# Patient Record
Sex: Female | Born: 1965 | Race: White | Hispanic: No | Marital: Married | Smoking: Never smoker
Health system: Southern US, Community
[De-identification: ages and names within clinical notes are randomized; demographics above are authoritative.]

## PROBLEM LIST (undated history)

## (undated) DIAGNOSIS — T7840XA Allergy, unspecified, initial encounter: Secondary | ICD-10-CM

## (undated) DIAGNOSIS — K219 Gastro-esophageal reflux disease without esophagitis: Secondary | ICD-10-CM

## (undated) DIAGNOSIS — I10 Essential (primary) hypertension: Secondary | ICD-10-CM

## (undated) DIAGNOSIS — R7303 Prediabetes: Secondary | ICD-10-CM

## (undated) DIAGNOSIS — F419 Anxiety disorder, unspecified: Secondary | ICD-10-CM

## (undated) DIAGNOSIS — Z8719 Personal history of other diseases of the digestive system: Secondary | ICD-10-CM

## (undated) HISTORY — PX: COLONOSCOPY: SHX174

## (undated) HISTORY — DX: Gastro-esophageal reflux disease without esophagitis: K21.9

## (undated) HISTORY — PX: CHOLECYSTECTOMY: SHX55

## (undated) HISTORY — DX: Anxiety disorder, unspecified: F41.9

## (undated) HISTORY — DX: Essential (primary) hypertension: I10

## (undated) HISTORY — PX: APPENDECTOMY: SHX54

## (undated) HISTORY — DX: Allergy, unspecified, initial encounter: T78.40XA

## (undated) HISTORY — DX: Personal history of other diseases of the digestive system: Z87.19

## (undated) HISTORY — DX: Prediabetes: R73.03

---

## 1998-06-19 ENCOUNTER — Other Ambulatory Visit: Admission: RE | Admit: 1998-06-19 | Discharge: 1998-06-19 | Payer: Self-pay | Admitting: Obstetrics and Gynecology

## 1999-06-01 ENCOUNTER — Other Ambulatory Visit: Admission: RE | Admit: 1999-06-01 | Discharge: 1999-06-01 | Payer: Self-pay | Admitting: Obstetrics and Gynecology

## 1999-06-16 ENCOUNTER — Inpatient Hospital Stay (HOSPITAL_COMMUNITY): Admission: EM | Admit: 1999-06-16 | Discharge: 1999-06-17 | Payer: Self-pay | Admitting: Emergency Medicine

## 1999-06-16 ENCOUNTER — Encounter: Payer: Self-pay | Admitting: Family Medicine

## 1999-06-16 ENCOUNTER — Encounter (INDEPENDENT_AMBULATORY_CARE_PROVIDER_SITE_OTHER): Payer: Self-pay | Admitting: Specialist

## 1999-06-16 ENCOUNTER — Encounter: Admission: RE | Admit: 1999-06-16 | Discharge: 1999-06-16 | Payer: Self-pay | Admitting: Family Medicine

## 2003-07-10 ENCOUNTER — Inpatient Hospital Stay (HOSPITAL_COMMUNITY): Admission: AD | Admit: 2003-07-10 | Discharge: 2003-07-10 | Payer: Self-pay | Admitting: Obstetrics and Gynecology

## 2003-08-12 ENCOUNTER — Encounter: Admission: RE | Admit: 2003-08-12 | Discharge: 2003-09-30 | Payer: Self-pay | Admitting: Obstetrics and Gynecology

## 2003-08-24 ENCOUNTER — Inpatient Hospital Stay (HOSPITAL_COMMUNITY): Admission: AD | Admit: 2003-08-24 | Discharge: 2003-08-25 | Payer: Self-pay | Admitting: Obstetrics and Gynecology

## 2003-09-30 ENCOUNTER — Inpatient Hospital Stay (HOSPITAL_COMMUNITY): Admission: AD | Admit: 2003-09-30 | Discharge: 2003-10-02 | Payer: Self-pay | Admitting: Obstetrics and Gynecology

## 2003-11-07 ENCOUNTER — Other Ambulatory Visit: Admission: RE | Admit: 2003-11-07 | Discharge: 2003-11-07 | Payer: Self-pay | Admitting: Obstetrics and Gynecology

## 2004-08-24 ENCOUNTER — Ambulatory Visit (HOSPITAL_COMMUNITY): Admission: EM | Admit: 2004-08-24 | Discharge: 2004-08-24 | Payer: Self-pay | Admitting: Emergency Medicine

## 2004-08-24 ENCOUNTER — Encounter (INDEPENDENT_AMBULATORY_CARE_PROVIDER_SITE_OTHER): Payer: Self-pay | Admitting: Specialist

## 2004-12-20 ENCOUNTER — Other Ambulatory Visit: Admission: RE | Admit: 2004-12-20 | Discharge: 2004-12-20 | Payer: Self-pay | Admitting: Obstetrics and Gynecology

## 2005-07-14 ENCOUNTER — Other Ambulatory Visit: Admission: RE | Admit: 2005-07-14 | Discharge: 2005-07-14 | Payer: Self-pay | Admitting: Obstetrics and Gynecology

## 2005-08-05 ENCOUNTER — Encounter: Admission: RE | Admit: 2005-08-05 | Discharge: 2005-08-05 | Payer: Self-pay | Admitting: Obstetrics and Gynecology

## 2006-08-11 ENCOUNTER — Encounter: Admission: RE | Admit: 2006-08-11 | Discharge: 2006-08-11 | Payer: Self-pay | Admitting: Obstetrics and Gynecology

## 2007-04-18 ENCOUNTER — Ambulatory Visit (HOSPITAL_COMMUNITY): Admission: RE | Admit: 2007-04-18 | Discharge: 2007-04-18 | Payer: Self-pay | Admitting: Obstetrics and Gynecology

## 2007-08-10 ENCOUNTER — Encounter: Admission: RE | Admit: 2007-08-10 | Discharge: 2007-08-10 | Payer: Self-pay | Admitting: Obstetrics and Gynecology

## 2007-10-23 ENCOUNTER — Ambulatory Visit: Payer: Self-pay | Admitting: Gastroenterology

## 2007-10-23 DIAGNOSIS — R197 Diarrhea, unspecified: Secondary | ICD-10-CM

## 2007-12-05 ENCOUNTER — Ambulatory Visit: Payer: Self-pay | Admitting: Gastroenterology

## 2007-12-18 ENCOUNTER — Telehealth: Payer: Self-pay | Admitting: Gastroenterology

## 2007-12-19 ENCOUNTER — Encounter: Payer: Self-pay | Admitting: Gastroenterology

## 2007-12-19 ENCOUNTER — Ambulatory Visit: Payer: Self-pay | Admitting: Gastroenterology

## 2007-12-23 ENCOUNTER — Encounter: Payer: Self-pay | Admitting: Gastroenterology

## 2008-08-22 ENCOUNTER — Encounter: Admission: RE | Admit: 2008-08-22 | Discharge: 2008-08-22 | Payer: Self-pay | Admitting: Obstetrics and Gynecology

## 2009-08-25 ENCOUNTER — Encounter: Admission: RE | Admit: 2009-08-25 | Discharge: 2009-08-25 | Payer: Self-pay | Admitting: Obstetrics and Gynecology

## 2010-04-18 ENCOUNTER — Encounter: Payer: Self-pay | Admitting: Obstetrics and Gynecology

## 2010-04-18 ENCOUNTER — Encounter: Payer: Self-pay | Admitting: Family Medicine

## 2010-06-02 ENCOUNTER — Other Ambulatory Visit: Payer: Self-pay | Admitting: Obstetrics and Gynecology

## 2010-06-02 DIAGNOSIS — Z1231 Encounter for screening mammogram for malignant neoplasm of breast: Secondary | ICD-10-CM

## 2010-08-13 NOTE — H&P (Signed)
Jillian Patton, Jillian Patton            ACCOUNT NO.:  [id number]   MEDICAL RECORD NO.:  [id number]          PATIENT TYPE:  INP   LOCATION:  0101                         FACILITY:  [facility]   PHYSICIAN:  Lorre Munroe., M.D.DATE OF BIRTH:  May 05, 1965   DATE OF ADMISSION:  08/24/2004  DATE OF DISCHARGE:                                HISTORY & PHYSICAL   CHIEF COMPLAINT:  Abdominal pain.   HISTORY OF PRESENT ILLNESS:  The patient is a healthy, 45 year old white  female who is known to have gallstones and who has had upper abdominal pain  with nausea for about 12 hours.  She saw her doctor this morning, and a  gallbladder ultrasound showed findings consistent with acute cholecystitis  with a thickened, edematous gallbladder wall.  The patient is referred to me  for care.  She has not had any dark urine or light stools.  Her total  bilirubin is 1.8, and the other liver tests are normal.  She thinks that  perhaps she had an elevated bilirubin in the past.  She has no history of  any liver disease.  She has not had a fever.  She is brought into the  hospital for a laparoscopic cholecystectomy.   PAST MEDICAL HISTORY:  She has high blood pressure which is well controlled  on labetalol.  She also is taking prenatal vitamins.  She is about 10 months  postpartum and is breastfeeding her baby.  She has had an appendectomy about  5 years ago and did well with no anesthetic or medical complications.  She  does not smoke.  She occasionally drinks alcoholic beverages very  moderately.   FAMILY HISTORY AND CHILDHOOD ILLNESSES:  Unremarkable.   A 15-point review of systems is unremarkable except as above.   PHYSICAL EXAMINATION:  VITAL SIGNS:  Afebrile and with normal vital signs as  recorded by nursing staff.  GENERAL APPEARANCE:  No acute distress.  She appears healthy.  HEENT/NECK:  Head, neck, eyes, ears, nose, mouth and throat are unremarkable  with no enlargement of the thyroid.  No thyroid  mass.  No neck mass.  No  abnormalities of the mucosa of the oral cavity.  No scleral icterus.  CHEST:  Clear to auscultation.  HEART:  Rate and rhythm are normal.  No murmur or gallop noted.  BREASTS:  Symmetric, normal and nontender.  No masses.  ABDOMEN:  Mild right upper quadrant tenderness.  No mass palpable.  The  remainder of the abdomen is unremarkable with good bowel sounds.  No masses,  no hernia, no tenderness.  EXTREMITIES:  Normal.  No edema, good pulses.  SKIN:  No lesions are noted.  LYMPH NODES:  Not enlarged in the groin, axilla or neck.  NEUROLOGICAL:  Grossly normal.   IMPRESSION:  Acute cholecystitis and cholelithiasis.   PLAN:  Immediate laparoscopic cholecystectomy.  She agrees to have this done  and accepts the risks of injuries to the intestines, the liver and the bile  ducts.  She accepts the convalescence of a week to 10 days, which is felt to  be necessary.  WB/MEDQ  D:  08/24/2004  T:  08/24/2004  Job:  [id number]

## 2010-08-13 NOTE — Op Note (Signed)
NAMELUCI, BELLUCCI            ACCOUNT NO.:  [id number]   MEDICAL RECORD NO.:  [id number]          PATIENT TYPE:  INP   LOCATION:  0101                         FACILITY:  [facility]   PHYSICIAN:  Lorre Munroe., M.D.DATE OF BIRTH:  [birth year]   DATE OF PROCEDURE:  08/24/2004  DATE OF DISCHARGE:                                 OPERATIVE REPORT   PREOPERATIVE DIAGNOSIS:  Cholelithiasis and acute cholecystitis.   POSTOPERATIVE DIAGNOSIS:  Cholelithiasis and acute cholecystitis.   OPERATION:  Laparoscopic cholecystectomy.   SURGEON:  Lebron Conners, M.D.   ANESTHESIA:  General.   PROCEDURE:  After the patient was monitored and anesthetized and had routine  preparation and draping of the abdomen, I infiltrated local anesthetic just  below the umbilicus and made a short transverse incision there.  Dissected  down to the fascia and opened it about 2 cm in the midline.  Then bluntly  opened the peritoneum.  I felt no adhesions in that area.  I placed a 0  Vicryl purse-string suture in the fascia and secured a Hasson cannula and  inflated the abdomen with CO2.  I saw no abnormalities except for a  distended, edematous gallbladder with a good bit of adhesion of both a  chronic and acute nature.  After anesthetizing for additional port sites,  including a 10 mm epigastric port and two 5 mm right abdominal ports,  retracted the fundus of the gallbladder towards the right shoulder and took  down the adhesions until I could see the infundibulum of the gallbladder.  The infundibulum was very long and narrow, and I followed it down carefully  and noted the emergence of a cystic duct.  Then I put a clip at the junction  of the cystic duct and infundibulum.  Made a small nick just distal to that  and cannulated the cystic duct with a Mixter cholangiogram catheter.  I then  performed a fluoroscopic cholangiogram, which showed adequate length of the  distal cystic duct, normal-sized biliary  ducts with normal anatomy, absence  of filling defects, and free flow into the duodenum.  I then resumed  laparoscopy and removed the clip and catheter and clipped the cystic duct  distally with three clips and then divided between the two clips closest to  the gallbladder.  I clipped the cystic artery with three clips and cut  between the two closest to the gallbladder.  I then used the cautery with  the hook dissector to separate the gallbladder from the liver.  It was very  edematous and thick-walled, as expected.  I did remove it intact.  There was  a little bit of bleeding from the gallbladder fossa, which I controlled with  cautery and with Surgicel.  I felt hemostasis was good at the end of the  operation.  The cystic duct and cystic artery clips appeared to be secure.  I then irrigated the operative area and removed the irrigant.  I saw no  evidence of continued bleeding or leakage of bile.  I removed the  gallbladder through the umbilical incision and tied the purse-string suture,  then removed  the two  lateral ports under direct view of the laparoscope and then allowed the  carbon dioxide to escape through the epigastric port and removed it as well.  I closed all skin incisions with intracuticular 4-0 Vicryl and Steri-Strips.  The patient was stable throughout the operation.      WB/MEDQ  D:  08/24/2004  T:  08/24/2004  Job:  [id number]   cc:   Ernestina Penna, M.D.  [address] [place]  [place] [address]  Fax: [phone]

## 2010-08-26 ENCOUNTER — Ambulatory Visit
Admission: RE | Admit: 2010-08-26 | Discharge: 2010-08-26 | Disposition: A | Discharge status: Home / Self Care | Payer: BC Managed Care – PPO | Source: Ambulatory Visit | Attending: Obstetrics and Gynecology | Admitting: Obstetrics and Gynecology

## 2010-08-26 DIAGNOSIS — Z1231 Encounter for screening mammogram for malignant neoplasm of breast: Secondary | ICD-10-CM

## 2011-06-07 ENCOUNTER — Other Ambulatory Visit: Payer: Self-pay | Admitting: Obstetrics and Gynecology

## 2011-06-07 DIAGNOSIS — Z803 Family history of malignant neoplasm of breast: Secondary | ICD-10-CM

## 2011-06-07 DIAGNOSIS — Z1231 Encounter for screening mammogram for malignant neoplasm of breast: Secondary | ICD-10-CM

## 2011-08-30 ENCOUNTER — Ambulatory Visit: Payer: BC Managed Care – PPO

## 2011-10-04 ENCOUNTER — Ambulatory Visit: Payer: BC Managed Care – PPO

## 2011-12-05 ENCOUNTER — Ambulatory Visit: Payer: BC Managed Care – PPO

## 2011-12-15 ENCOUNTER — Other Ambulatory Visit: Payer: Self-pay | Admitting: Obstetrics and Gynecology

## 2011-12-19 ENCOUNTER — Ambulatory Visit
Admission: RE | Admit: 2011-12-19 | Discharge: 2011-12-19 | Disposition: A | Discharge status: Home / Self Care | Payer: BC Managed Care – PPO | Source: Ambulatory Visit | Attending: Obstetrics and Gynecology | Admitting: Obstetrics and Gynecology

## 2011-12-19 DIAGNOSIS — Z803 Family history of malignant neoplasm of breast: Secondary | ICD-10-CM

## 2011-12-19 DIAGNOSIS — Z1231 Encounter for screening mammogram for malignant neoplasm of breast: Secondary | ICD-10-CM

## 2012-03-03 ENCOUNTER — Ambulatory Visit: Payer: BC Managed Care – PPO | Admitting: Internal Medicine

## 2012-03-03 VITALS — BP 127/79 | HR 65 | Temp 97.7°F | Resp 16 | Ht 63.5 in | Wt 126.0 lb

## 2012-03-03 DIAGNOSIS — I10 Essential (primary) hypertension: Secondary | ICD-10-CM

## 2012-03-03 DIAGNOSIS — R21 Rash and other nonspecific skin eruption: Secondary | ICD-10-CM

## 2012-03-03 DIAGNOSIS — R002 Palpitations: Secondary | ICD-10-CM | POA: Insufficient documentation

## 2012-03-03 NOTE — Progress Notes (Signed)
  Subjective:    Patient ID: Jillian Patton, female    DOB: 02/27/1966, 46 y.o.   MRN: [id number]  HPI Patient presents with red bumps/rash on chest, back, stomach, and arms x 5 days Patient states it does not itch, burn or hurt Patient is also feeling light headed and dizzy occasionally at work No new medications in the past 2 weeks No cold like symptoms in the past 4 weeks Patient takes Claritin and Zyrtec and labetalol    Review of Systems Labetalol followed by Dr. Tanya Nones for palpitations? SVT   no fever chills or night sweats No GI or GU symptoms  Objective:   Physical Exam Vital signs stable Scattered small maculopapular lesions in a symmetric fashion over torso and upper extremities No vesiculation/no petechia/no hives Face and lower extremities not involved No regional adenopathy      Assessment & Plan:  Problem #1 disseminated rash most consistent with pityriasis rosea Reassured Keep skin moist Reck 3 wks if not well

## 2012-10-29 ENCOUNTER — Other Ambulatory Visit: Payer: Self-pay

## 2012-10-29 DIAGNOSIS — Z1231 Encounter for screening mammogram for malignant neoplasm of breast: Secondary | ICD-10-CM

## 2012-10-31 ENCOUNTER — Encounter: Payer: Self-pay | Admitting: Gastroenterology

## 2012-11-07 ENCOUNTER — Encounter: Payer: Self-pay | Admitting: Gastroenterology

## 2012-12-20 ENCOUNTER — Ambulatory Visit: Payer: BC Managed Care – PPO

## 2013-01-03 ENCOUNTER — Other Ambulatory Visit: Payer: Self-pay | Admitting: Obstetrics and Gynecology

## 2013-01-09 ENCOUNTER — Ambulatory Visit
Admission: RE | Admit: 2013-01-09 | Discharge: 2013-01-09 | Disposition: A | Discharge status: Home / Self Care | Payer: BC Managed Care – PPO | Source: Ambulatory Visit

## 2013-01-09 DIAGNOSIS — Z1231 Encounter for screening mammogram for malignant neoplasm of breast: Secondary | ICD-10-CM

## 2013-02-05 ENCOUNTER — Encounter: Payer: BC Managed Care – PPO | Admitting: Gastroenterology

## 2013-11-22 ENCOUNTER — Other Ambulatory Visit: Payer: Self-pay

## 2013-11-22 DIAGNOSIS — Z1231 Encounter for screening mammogram for malignant neoplasm of breast: Secondary | ICD-10-CM

## 2013-12-07 ENCOUNTER — Encounter: Payer: Self-pay | Admitting: Gastroenterology

## 2014-01-09 ENCOUNTER — Other Ambulatory Visit: Payer: Self-pay | Admitting: Obstetrics and Gynecology

## 2014-01-10 LAB — CYTOLOGY - PAP

## 2014-01-13 ENCOUNTER — Encounter (INDEPENDENT_AMBULATORY_CARE_PROVIDER_SITE_OTHER): Payer: Self-pay

## 2014-01-13 ENCOUNTER — Ambulatory Visit
Admission: RE | Admit: 2014-01-13 | Discharge: 2014-01-13 | Disposition: A | Discharge status: Home / Self Care | Payer: 59 | Source: Ambulatory Visit

## 2014-01-13 DIAGNOSIS — Z1231 Encounter for screening mammogram for malignant neoplasm of breast: Secondary | ICD-10-CM

## 2014-05-20 ENCOUNTER — Encounter: Payer: Self-pay | Admitting: Gastroenterology

## 2014-06-09 ENCOUNTER — Ambulatory Visit: Payer: Self-pay | Admitting: Internal Medicine

## 2014-12-04 ENCOUNTER — Other Ambulatory Visit: Payer: Self-pay

## 2014-12-04 DIAGNOSIS — Z1231 Encounter for screening mammogram for malignant neoplasm of breast: Secondary | ICD-10-CM

## 2015-01-15 ENCOUNTER — Ambulatory Visit
Admission: RE | Admit: 2015-01-15 | Discharge: 2015-01-15 | Disposition: A | Discharge status: Home / Self Care | Payer: Commercial Managed Care - HMO | Source: Ambulatory Visit

## 2015-01-15 DIAGNOSIS — Z1231 Encounter for screening mammogram for malignant neoplasm of breast: Secondary | ICD-10-CM

## 2015-05-11 DIAGNOSIS — G5603 Carpal tunnel syndrome, bilateral upper limbs: Secondary | ICD-10-CM | POA: Insufficient documentation

## 2015-12-15 ENCOUNTER — Encounter: Payer: Self-pay | Admitting: Gastroenterology

## 2015-12-16 ENCOUNTER — Other Ambulatory Visit: Payer: Self-pay | Admitting: Obstetrics and Gynecology

## 2015-12-16 DIAGNOSIS — Z1231 Encounter for screening mammogram for malignant neoplasm of breast: Secondary | ICD-10-CM

## 2016-01-05 ENCOUNTER — Other Ambulatory Visit: Payer: Self-pay | Admitting: Family Medicine

## 2016-01-05 DIAGNOSIS — R1011 Right upper quadrant pain: Secondary | ICD-10-CM

## 2016-01-14 ENCOUNTER — Ambulatory Visit (AMBULATORY_SURGERY_CENTER): Payer: Self-pay | Admitting: *Deleted

## 2016-01-14 DIAGNOSIS — Z8371 Family history of colonic polyps: Secondary | ICD-10-CM

## 2016-01-14 MED ORDER — NA SULFATE-K SULFATE-MG SULF 17.5-3.13-1.6 GM/177ML PO SOLN: 1.0000 | Freq: Once | ORAL | 0 refills | Status: AC

## 2016-01-14 NOTE — Progress Notes (Signed)
No egg or soy allergy. No anesthesia problems.  No home O2.  No diet meds.  

## 2016-01-15 ENCOUNTER — Ambulatory Visit
Admission: RE | Admit: 2016-01-15 | Discharge: 2016-01-15 | Disposition: A | Discharge status: Home / Self Care | Payer: Commercial Managed Care - HMO | Source: Ambulatory Visit | Attending: Family Medicine | Admitting: Family Medicine

## 2016-01-15 DIAGNOSIS — R1011 Right upper quadrant pain: Secondary | ICD-10-CM

## 2016-01-21 ENCOUNTER — Ambulatory Visit
Admission: RE | Admit: 2016-01-21 | Discharge: 2016-01-21 | Disposition: A | Discharge status: Home / Self Care | Payer: Commercial Managed Care - HMO | Source: Ambulatory Visit | Attending: Obstetrics and Gynecology | Admitting: Obstetrics and Gynecology

## 2016-01-21 DIAGNOSIS — Z1231 Encounter for screening mammogram for malignant neoplasm of breast: Secondary | ICD-10-CM

## 2016-01-22 ENCOUNTER — Encounter: Payer: Self-pay | Admitting: Gastroenterology

## 2016-02-04 ENCOUNTER — Ambulatory Visit (AMBULATORY_SURGERY_CENTER): Payer: Commercial Managed Care - HMO | Admitting: Gastroenterology

## 2016-02-04 ENCOUNTER — Encounter: Payer: Self-pay | Admitting: Gastroenterology

## 2016-02-04 VITALS — BP 121/69 | HR 71 | Temp 98.6°F | Resp 17 | Ht 63.5 in | Wt 126.0 lb

## 2016-02-04 DIAGNOSIS — Z1211 Encounter for screening for malignant neoplasm of colon: Secondary | ICD-10-CM | POA: Diagnosis not present

## 2016-02-04 DIAGNOSIS — Z1212 Encounter for screening for malignant neoplasm of rectum: Secondary | ICD-10-CM | POA: Diagnosis not present

## 2016-02-04 DIAGNOSIS — Z8371 Family history of colonic polyps: Secondary | ICD-10-CM | POA: Diagnosis not present

## 2016-02-04 DIAGNOSIS — K635 Polyp of colon: Secondary | ICD-10-CM

## 2016-02-04 DIAGNOSIS — D125 Benign neoplasm of sigmoid colon: Secondary | ICD-10-CM

## 2016-02-04 MED ORDER — SODIUM CHLORIDE 0.9 % IV SOLN: 500.0000 mL | INTRAVENOUS | Status: DC

## 2016-02-04 NOTE — Patient Instructions (Signed)
Impression/Recommendations:  Polyp handout given to patient. Hemorrhoids handout given to patient. Diverticulosis handout given to patient.  Repeat colonoscopy in 5 years for surveillance.  YOU HAD AN ENDOSCOPIC PROCEDURE TODAY AT [facility]:   Refer to the procedure report that was given to you for any specific questions about what was found during the examination.  If the procedure report does not answer your questions, please call your gastroenterologist to clarify.  If you requested that your care partner not be given the details of your procedure findings, then the procedure report has been included in a sealed envelope for you to review at your convenience later.  YOU SHOULD EXPECT: Some feelings of bloating in the abdomen. Passage of more gas than usual.  Walking can help get rid of the air that was put into your GI tract during the procedure and reduce the bloating. If you had a lower endoscopy (such as a colonoscopy or flexible sigmoidoscopy) you may notice spotting of blood in your stool or on the toilet paper. If you underwent a bowel prep for your procedure, you may not have a normal bowel movement for a few days.  Please Note:  You might notice some irritation and congestion in your nose or some drainage.  This is from the oxygen used during your procedure.  There is no need for concern and it should clear up in a day or so.  SYMPTOMS TO REPORT IMMEDIATELY:   Following lower endoscopy (colonoscopy or flexible sigmoidoscopy):  Excessive amounts of blood in the stool  Significant tenderness or worsening of abdominal pains  Swelling of the abdomen that is new, acute  Fever of 100F or higher For urgent or emergent issues, a gastroenterologist can be reached at any hour by calling [phone].   DIET:  We do recommend a small meal at first, but then you may proceed to your regular diet.  Drink plenty of fluids but you should avoid alcoholic beverages for 24  hours.  ACTIVITY:  You should plan to take it easy for the rest of today and you should NOT DRIVE or use heavy machinery until tomorrow (because of the sedation medicines used during the test).    FOLLOW UP: Our staff will call the number listed on your records the next business day following your procedure to check on you and address any questions or concerns that you may have regarding the information given to you following your procedure. If we do not reach you, we will leave a message.  However, if you are feeling well and you are not experiencing any problems, there is no need to return our call.  We will assume that you have returned to your regular daily activities without incident.  If any biopsies were taken you will be contacted by phone or by letter within the next 1-3 weeks.  Please call us at [phone] if you have not heard about the biopsies in 3 weeks.    SIGNATURES/CONFIDENTIALITY: You and/or your care partner have signed paperwork which will be entered into your electronic medical record.  These signatures attest to the fact that that the information above on your After Visit Summary has been reviewed and is understood.  Full responsibility of the confidentiality of this discharge information lies with you and/or your care-partner.

## 2016-02-04 NOTE — Op Note (Signed)
[facility] Patient Name: Jillian Patton Procedure Date: 02/04/2016 7:47 AM MRN: [id number] Endoscopist: Ladene Artist , MD Age: 50 Referring MD:  Date of Birth: 1965-09-20 Gender: Female Account #: [id number] Procedure:                Colonoscopy Indications:              Colon cancer screening in patient at increased                            risk: Family history of 1st-degree relative with                            colon polyps Medicines:                Monitored Anesthesia Care Procedure:                Pre-Anesthesia Assessment:                           - Prior to the procedure, a History and Physical                            was performed, and patient medications and                            allergies were reviewed. The patient's tolerance of                            previous anesthesia was also reviewed. The risks                            and benefits of the procedure and the sedation                            options and risks were discussed with the patient.                            All questions were answered, and informed consent                            was obtained. Prior Anticoagulants: The patient has                            taken no previous anticoagulant or antiplatelet                            agents. ASA Grade Assessment: II - A patient with                            mild systemic disease. After reviewing the risks                            and benefits, the patient was deemed in  satisfactory condition to undergo the procedure.                           After obtaining informed consent, the colonoscope                            was passed under direct vision. Throughout the                            procedure, the patient's blood pressure, pulse, and                            oxygen saturations were monitored continuously. The                            Model PCF-[id number]) scope was  introduced                            through the anus and advanced to the the cecum,                            identified by appendiceal orifice and ileocecal                            valve. The ileocecal valve, appendiceal orifice,                            and rectum were photographed. The quality of the                            bowel preparation was good. The colonoscopy was                            performed without difficulty. The patient tolerated                            the procedure well. Scope In: 8:17:47 AM Scope Out: 8:31:44 AM Scope Withdrawal Time: 0 hours 10 minutes 55 seconds  Total Procedure Duration: 0 hours 13 minutes 57 seconds  Findings:                 The perianal and digital rectal examinations were                            normal.                           A 5 mm polyp was found in the sigmoid colon. The                            polyp was sessile. The polyp was removed with a                            cold snare. Resection and retrieval were complete.  Internal hemorrhoids were found during                            retroflexion. The hemorrhoids were small and Grade                            I (internal hemorrhoids that do not prolapse).                           A few small-mouthed diverticula were found in the                            sigmoid colon.                           The exam was otherwise without abnormality on                            direct and retroflexion views. Complications:            No immediate complications. Estimated blood loss:                            None. Estimated Blood Loss:     Estimated blood loss: none. Impression:               - One 5 mm polyp in the sigmoid colon, removed with                            a cold snare. Resected and retrieved.                           - Internal hemorrhoids.                           - Diverticulosis in the sigmoid colon.                            - The examination was otherwise normal on direct                            and retroflexion views. Recommendation:           - Repeat colonoscopy in 5 years for surveillance.                           - Patient has a contact number available for                            emergencies. The signs and symptoms of potential                            delayed complications were discussed with the                            patient. Return to normal activities tomorrow.  Written discharge instructions were provided to the                            patient.                           - Resume previous diet.                           - Continue present medications.                           - Await pathology results. Ladene Artist, MD 02/04/2016 8:36:47 AM This report has been signed electronically.

## 2016-02-04 NOTE — Progress Notes (Signed)
Called to room to assist during endoscopic procedure.  Patient ID and intended procedure confirmed with present staff. Received instructions for my participation in the procedure from the performing physician.  

## 2016-02-05 ENCOUNTER — Telehealth: Payer: Self-pay | Admitting: *Deleted

## 2016-02-05 NOTE — Telephone Encounter (Signed)
  Follow up Call-  Call back number 02/04/2016  Post procedure Call Back phone  # [phone]  Permission to leave phone message Yes  Some recent data might be hidden     Patient questions:  Do you have a fever, pain , or abdominal swelling? No. Pain Score  0 *  Have you tolerated food without any problems? Yes.    Have you been able to return to your normal activities? Yes.    Do you have any questions about your discharge instructions: Diet   No. Medications  No. Follow up visit  No.  Do you have questions or concerns about your Care? No.  Actions: * If pain score is 4 or above: No action needed, pain <4.

## 2016-02-11 ENCOUNTER — Encounter: Payer: Self-pay | Admitting: Gastroenterology

## 2016-02-16 ENCOUNTER — Telehealth: Payer: Self-pay | Admitting: Gastroenterology

## 2016-02-16 NOTE — Telephone Encounter (Signed)
I reviewed the results with the patient and remailed the pathology letter

## 2016-07-14 DIAGNOSIS — Z79899 Other long term (current) drug therapy: Secondary | ICD-10-CM | POA: Diagnosis not present

## 2016-07-14 DIAGNOSIS — I10 Essential (primary) hypertension: Secondary | ICD-10-CM | POA: Diagnosis not present

## 2016-07-14 DIAGNOSIS — Z23 Encounter for immunization: Secondary | ICD-10-CM | POA: Diagnosis not present

## 2016-07-14 DIAGNOSIS — J309 Allergic rhinitis, unspecified: Secondary | ICD-10-CM | POA: Diagnosis not present

## 2016-07-14 DIAGNOSIS — E559 Vitamin D deficiency, unspecified: Secondary | ICD-10-CM | POA: Diagnosis not present

## 2016-08-19 DIAGNOSIS — L309 Dermatitis, unspecified: Secondary | ICD-10-CM | POA: Diagnosis not present

## 2016-08-19 DIAGNOSIS — L219 Seborrheic dermatitis, unspecified: Secondary | ICD-10-CM | POA: Diagnosis not present

## 2016-11-22 DIAGNOSIS — R1011 Right upper quadrant pain: Secondary | ICD-10-CM | POA: Diagnosis not present

## 2016-12-22 DIAGNOSIS — Z23 Encounter for immunization: Secondary | ICD-10-CM | POA: Diagnosis not present

## 2016-12-29 ENCOUNTER — Other Ambulatory Visit: Payer: Self-pay | Admitting: Obstetrics and Gynecology

## 2016-12-29 DIAGNOSIS — Z1239 Encounter for other screening for malignant neoplasm of breast: Secondary | ICD-10-CM

## 2017-01-13 ENCOUNTER — Other Ambulatory Visit (INDEPENDENT_AMBULATORY_CARE_PROVIDER_SITE_OTHER): Payer: 59

## 2017-01-13 ENCOUNTER — Ambulatory Visit (INDEPENDENT_AMBULATORY_CARE_PROVIDER_SITE_OTHER): Payer: 59 | Admitting: Gastroenterology

## 2017-01-13 ENCOUNTER — Encounter: Payer: Self-pay | Admitting: Gastroenterology

## 2017-01-13 VITALS — BP 128/90 | HR 80 | Ht 63.5 in | Wt 155.0 lb

## 2017-01-13 DIAGNOSIS — R079 Chest pain, unspecified: Secondary | ICD-10-CM

## 2017-01-13 DIAGNOSIS — R1011 Right upper quadrant pain: Secondary | ICD-10-CM | POA: Diagnosis not present

## 2017-01-13 LAB — HEPATIC FUNCTION PANEL
ALBUMIN: 4.8 g/dL (ref 3.5–5.2)
ALK PHOS: 62 U/L (ref 39–117)
ALT: 21 U/L (ref 0–35)
AST: 19 U/L (ref 0–37)
BILIRUBIN TOTAL: 1.1 mg/dL (ref 0.2–1.2)
Bilirubin, Direct: 0.1 mg/dL (ref 0.0–0.3)
Total Protein: 7.2 g/dL (ref 6.0–8.3)

## 2017-01-13 LAB — LIPASE: Lipase: 38 U/L (ref 11.0–59.0)

## 2017-01-13 LAB — CBC WITH DIFFERENTIAL/PLATELET
BASOS PCT: 0.5 % (ref 0.0–3.0)
Basophils Absolute: 0 10*3/uL (ref 0.0–0.1)
EOS PCT: 1.6 % (ref 0.0–5.0)
Eosinophils Absolute: 0.1 10*3/uL (ref 0.0–0.7)
HCT: 41.3 % (ref 36.0–46.0)
HEMOGLOBIN: 14.4 g/dL (ref 12.0–15.0)
Lymphocytes Relative: 33.8 % (ref 12.0–46.0)
Lymphs Abs: 2.3 10*3/uL (ref 0.7–4.0)
MCHC: 34.9 g/dL (ref 30.0–36.0)
MCV: 89.7 fl (ref 78.0–100.0)
MONOS PCT: 6.9 % (ref 3.0–12.0)
Monocytes Absolute: 0.5 10*3/uL (ref 0.1–1.0)
Neutro Abs: 3.9 10*3/uL (ref 1.4–7.7)
Neutrophils Relative %: 57.2 % (ref 43.0–77.0)
Platelets: 241 10*3/uL (ref 150.0–400.0)
RBC: 4.61 Mil/uL (ref 3.87–5.11)
RDW: 12.8 % (ref 11.5–15.5)
WBC: 6.8 10*3/uL (ref 4.0–10.5)

## 2017-01-13 LAB — BASIC METABOLIC PANEL
BUN: 18 mg/dL (ref 6–23)
CALCIUM: 9.9 mg/dL (ref 8.4–10.5)
CO2: 31 mEq/L (ref 19–32)
Chloride: 98 mEq/L (ref 96–112)
Creatinine, Ser: 0.63 mg/dL (ref 0.40–1.20)
GFR: 106.31 mL/min (ref >60.00)
GLUCOSE: 97 mg/dL (ref 70–99)
POTASSIUM: 3.8 meq/L (ref 3.5–5.1)
SODIUM: 139 meq/L (ref 135–145)

## 2017-01-13 LAB — TSH: TSH: 0.6 u[IU]/mL (ref 0.35–4.50)

## 2017-01-13 NOTE — Addendum Note (Signed)
Addended by: Marzella Schlein on: 01/13/2017 11:56 AM   Modules accepted: Orders

## 2017-01-13 NOTE — Progress Notes (Signed)
History of Present Illness: This is a 51 year old female for the evaluation of intermittent lower right chest and RUQ pain. She describes the pain as an ache or soreness lasting last several days at a time. It is not associated with any particular activity and not associated with meals or bowel movements. She relates that blood work performed by her PCP in October 2017 was unremarkable. Abdominal ultrasound below. She has not tried acid reducing medications or over-the-counter analgesics for her symptoms. Denies weight loss, constipation, diarrhea, change in stool caliber, melena, hematochezia, nausea, vomiting, dysphagia, reflux symptoms.  Abd [place] 12/2015 IMPRESSION: Status post cholecystectomy. No other significant abnormality seen in the abdomen.   Allergies  Allergen Reactions  . Sulfamethoxazole-Trimethoprim Other (See Comments)    Patient cant remeber  . Latex Rash   Outpatient Medications Prior to Visit  Medication Sig Dispense Refill  . ALPRAZolam (XANAX) 0.5 MG tablet Take 0.5 mg by mouth at bedtime as needed for anxiety.    . Calcium Citrate (CITRACAL PO) Take 1 tablet by mouth daily.     . cetirizine (ZYRTEC) 10 MG tablet Take 10 mg by mouth daily.    . cholecalciferol (VITAMIN D) 1000 UNITS tablet Take 1,000 Units by mouth daily.    Marland Kitchen losartan-hydrochlorothiazide (HYZAAR) 50-12.5 MG tablet Take 1 tablet by mouth daily.    . montelukast (SINGULAIR) 10 MG tablet Take 10 mg by mouth at bedtime.    . Multiple Vitamin (MULTI-DAY PO) Take by mouth.     Facility-Administered Medications Prior to Visit  Medication Dose Route Frequency Provider Last Rate Last Dose  . 0.9 %  sodium chloride infusion  500 mL Intravenous Continuous Ladene Artist, MD       Past Medical History:  Diagnosis Date  . Allergy   . Anxiety   . Hypertension    Past Surgical History:  Procedure Laterality Date  . APPENDECTOMY    . CHOLECYSTECTOMY    . COLONOSCOPY     Social History   Social  History  . Marital status: Married    Spouse name: N/A  . Number of children: N/A  . Years of education: N/A   Social History Main Topics  . Smoking status: Never Smoker  . Smokeless tobacco: Never Used  . Alcohol use 2.0 oz/week    4 Standard drinks or equivalent per week  . Drug use: No  . Sexual activity: Not Asked   Other Topics Concern  . None   Social History Narrative  . None   Family History  Problem Relation Age of Onset  . Hypertension Mother   . Colon polyps Mother   . Hypertension Father   . Colon polyps Father   . Colon cancer Neg Hx       Review of Systems: Pertinent positive and negative review of systems were noted in the above HPI section. All other review of systems were otherwise negative.    Physical Exam: General: Well developed, well nourished, no acute distress Head: Normocephalic and atraumatic Eyes:  sclerae anicteric, EOMI Ears: Normal auditory acuity Mouth: No deformity or lesions Neck: Supple, no masses or thyromegaly Lungs: Clear throughout to auscultation Chest: She localized the area of pain to her right lower anterior ribs/costal margin, however no tenderness was noted today.  Heart: Regular rate and rhythm; no murmurs, rubs or bruits Abdomen: Soft, non tender and non distended. No masses, hepatosplenomegaly or hernias noted. Normal Bowel sounds Rectal: not done Musculoskeletal: Symmetrical with no  gross deformities  Skin: No lesions on visible extremities Pulses:  Normal pulses noted Extremities: No clubbing, cyanosis, edema or deformities noted Neurological: Alert oriented x 4, grossly nonfocal Cervical Nodes:  No significant cervical adenopathy Inguinal Nodes: No significant inguinal adenopathy Psychological:  Alert and cooperative. Normal mood and affect  Assessment and Recommendations:  1. Right costal margin pain/RUQ pain. Rule out occult gastrointestinal lesions. Suspect that her symptoms are musculoskeletal. Advised her  to try Advil or Tylenol when her symptoms flare. Obtain CMP, CBC, TSH, lipase today. Schedule abdominal CT scan. If symptoms persist without clear etiology determined consider EGD.  2. Personal history of adenomatous colon polyps. Five-year interval surveillance colonoscopy is recommended in November 2022

## 2017-01-13 NOTE — Patient Instructions (Signed)
Your physician has requested that you go to the basement for lab work before leaving today.  You have been scheduled for a CT scan of the abdomen and pelvis at [facility] ([address]---this is in the same building as Press [facility]).   You are scheduled on 01/18/17 at 9:30am. You should arrive 15 minutes prior to your appointment time for registration. Please follow the written instructions below on the day of your exam:  WARNING: IF YOU ARE ALLERGIC TO IODINE/X-RAY DYE, PLEASE NOTIFY RADIOLOGY IMMEDIATELY AT [phone]! YOU WILL BE GIVEN A 13 HOUR PREMEDICATION PREP.  1) Do not eat or drink anything after 5:30am (4 hours prior to your test) 2) You have been given 1 bottles of oral contrast to drink. The solution may taste               better if refrigerated, but do NOT add ice or any other liquid to this solution. Shake             well before drinking.      Drink 1 bottle of contrast @ 8:30am (1 hour prior to your exam)  You may take any medications as prescribed with a small amount of water except for the following: Metformin, Glucophage, Glucovance, Avandamet, Riomet, Fortamet, Actoplus Met, Janumet, Glumetza or Metaglip. The above medications must be held the day of the exam AND 48 hours after the exam.  The purpose of you drinking the oral contrast is to aid in the visualization of your intestinal tract. The contrast solution may cause some diarrhea. Before your exam is started, you will be given a small amount of fluid to drink. Depending on your individual set of symptoms, you may also receive an intravenous injection of x-ray contrast/dye. Plan on being at [facility] for 30 minutes or longer, depending on the type of exam you are having performed.  This test typically takes 30-45 minutes to complete.  If you have any questions regarding your exam or if you need to reschedule, you may call the CT department at [phone] between the hours of 8:00 am and  5:00 pm, Monday-Friday.  ________________________________________________________________________  Thank you for choosing me and [facility].  Pricilla Riffle. Dagoberto Ligas., MD., Marval Regal

## 2017-01-18 ENCOUNTER — Ambulatory Visit (INDEPENDENT_AMBULATORY_CARE_PROVIDER_SITE_OTHER)
Admission: RE | Admit: 2017-01-18 | Discharge: 2017-01-18 | Disposition: A | Discharge status: Home / Self Care | Payer: 59 | Source: Ambulatory Visit | Attending: Gastroenterology | Admitting: Gastroenterology

## 2017-01-18 DIAGNOSIS — R1011 Right upper quadrant pain: Secondary | ICD-10-CM | POA: Diagnosis not present

## 2017-01-18 DIAGNOSIS — K5730 Diverticulosis of large intestine without perforation or abscess without bleeding: Secondary | ICD-10-CM | POA: Diagnosis not present

## 2017-01-18 DIAGNOSIS — R079 Chest pain, unspecified: Secondary | ICD-10-CM

## 2017-01-18 MED ORDER — IOPAMIDOL (ISOVUE-300) INJECTION 61%
100.0000 mL | Freq: Once | INTRAVENOUS | Status: AC | PRN
  Administered 2017-01-18: 100 mL via INTRAVENOUS

## 2017-01-20 ENCOUNTER — Ambulatory Visit (AMBULATORY_SURGERY_CENTER): Payer: Self-pay

## 2017-01-20 VITALS — Ht 63.5 in | Wt 158.6 lb

## 2017-01-20 DIAGNOSIS — R1011 Right upper quadrant pain: Secondary | ICD-10-CM

## 2017-01-20 MED ORDER — NA SULFATE-K SULFATE-MG SULF 17.5-3.13-1.6 GM/177ML PO SOLN: 1.0000 | Freq: Once | ORAL | 0 refills | Status: AC

## 2017-01-20 NOTE — Progress Notes (Signed)
Denies allergies to eggs or soy products. Denies complication of anesthesia or sedation. Denies use of weight loss medication. Denies use of O2.   Emmi instructions declined.  

## 2017-01-23 ENCOUNTER — Telehealth: Payer: Self-pay | Admitting: Gastroenterology

## 2017-01-23 NOTE — Telephone Encounter (Signed)
Returned call to pt .  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  Talked through Miralax prep.  Agreed this should be the best choice for her.  Staff msg sent to MS to be sure he doesn't object. Jayvion Stefanski/PV

## 2017-01-24 NOTE — Telephone Encounter (Signed)
OK 

## 2017-01-31 ENCOUNTER — Ambulatory Visit
Admission: RE | Admit: 2017-01-31 | Discharge: 2017-01-31 | Disposition: A | Discharge status: Home / Self Care | Payer: Commercial Managed Care - HMO | Source: Ambulatory Visit | Attending: Obstetrics and Gynecology | Admitting: Obstetrics and Gynecology

## 2017-01-31 DIAGNOSIS — Z1239 Encounter for other screening for malignant neoplasm of breast: Secondary | ICD-10-CM

## 2017-01-31 DIAGNOSIS — Z1231 Encounter for screening mammogram for malignant neoplasm of breast: Secondary | ICD-10-CM | POA: Diagnosis not present

## 2017-02-01 ENCOUNTER — Encounter: Payer: Self-pay | Admitting: Gastroenterology

## 2017-02-13 ENCOUNTER — Encounter: Payer: Self-pay | Admitting: Gastroenterology

## 2017-02-13 ENCOUNTER — Other Ambulatory Visit: Payer: Self-pay

## 2017-02-13 ENCOUNTER — Ambulatory Visit (AMBULATORY_SURGERY_CENTER): Payer: 59 | Admitting: Gastroenterology

## 2017-02-13 VITALS — BP 124/81 | HR 72 | Temp 98.6°F | Resp 27 | Ht 63.5 in | Wt 155.0 lb

## 2017-02-13 DIAGNOSIS — K635 Polyp of colon: Secondary | ICD-10-CM

## 2017-02-13 DIAGNOSIS — R933 Abnormal findings on diagnostic imaging of other parts of digestive tract: Secondary | ICD-10-CM | POA: Diagnosis present

## 2017-02-13 DIAGNOSIS — D125 Benign neoplasm of sigmoid colon: Secondary | ICD-10-CM | POA: Diagnosis not present

## 2017-02-13 MED ORDER — SODIUM CHLORIDE 0.9 % IV SOLN: 500.0000 mL | INTRAVENOUS | Status: DC

## 2017-02-13 NOTE — Patient Instructions (Signed)
YOU HAD AN ENDOSCOPIC PROCEDURE TODAY AT THE [facility]:   Refer to the procedure report that was given to you for any specific questions about what was found during the examination.  If the procedure report does not answer your questions, please call your gastroenterologist to clarify.  If you requested that your care partner not be given the details of your procedure findings, then the procedure report has been included in a sealed envelope for you to review at your convenience later.  YOU SHOULD EXPECT: Some feelings of bloating in the abdomen. Passage of more gas than usual.  Walking can help get rid of the air that was put into your GI tract during the procedure and reduce the bloating. If you had a lower endoscopy (such as a colonoscopy or flexible sigmoidoscopy) you may notice spotting of blood in your stool or on the toilet paper. If you underwent a bowel prep for your procedure, you may not have a normal bowel movement for a few days.  Please Note:  You might notice some irritation and congestion in your nose or some drainage.  This is from the oxygen used during your procedure.  There is no need for concern and it should clear up in a day or so.  SYMPTOMS TO REPORT IMMEDIATELY:   Following lower endoscopy (colonoscopy or flexible sigmoidoscopy):  Excessive amounts of blood in the stool  Significant tenderness or worsening of abdominal pains  Swelling of the abdomen that is new, acute  Fever of 100F or higher   For urgent or emergent issues, a gastroenterologist can be reached at any hour by calling [phone].   DIET:  We do recommend a small meal at first, but then you may proceed to your regular diet.  Drink plenty of fluids but you should avoid alcoholic beverages for 24 hours.  ACTIVITY:  You should plan to take it easy for the rest of today and you should NOT DRIVE or use heavy machinery until tomorrow (because of the sedation medicines used during the test).     FOLLOW UP: Our staff will call the number listed on your records the next business day following your procedure to check on you and address any questions or concerns that you may have regarding the information given to you following your procedure. If we do not reach you, we will leave a message.  However, if you are feeling well and you are not experiencing any problems, there is no need to return our call.  We will assume that you have returned to your regular daily activities without incident.  If any biopsies were taken you will be contacted by phone or by letter within the next 1-3 weeks.  Please call us at [phone] if you have not heard about the biopsies in 3 weeks.    SIGNATURES/CONFIDENTIALITY: You and/or your care partner have signed paperwork which will be entered into your electronic medical record.  These signatures attest to the fact that that the information above on your After Visit Summary has been reviewed and is understood.  Full responsibility of the confidentiality of this discharge information lies with you and/or your care-partner.  Polyp information given. 

## 2017-02-13 NOTE — Progress Notes (Signed)
Pt. Reports no change in her medical or surgical history since her pre-visit 01/20/2017.

## 2017-02-13 NOTE — Op Note (Signed)
[place] Patient Name: Jillian Patton Procedure Date: 02/13/2017 10:35 AM MRN: [id number] Endoscopist: Ladene Artist , MD Age: 51 Referring MD:  Date of Birth: February 07, 1966 Gender: Female Account #: [id number] Procedure:                Colonoscopy Indications:              Abnormal CT of the GI tract Medicines:                Monitored Anesthesia Care Procedure:                Pre-Anesthesia Assessment:                           - Prior to the procedure, a History and Physical                            was performed, and patient medications and                            allergies were reviewed. The patient's tolerance of                            previous anesthesia was also reviewed. The risks                            and benefits of the procedure and the sedation                            options and risks were discussed with the patient.                            All questions were answered, and informed consent                            was obtained. Prior Anticoagulants: The patient has                            taken no previous anticoagulant or antiplatelet                            agents. ASA Grade Assessment: II - A patient with                            mild systemic disease. After reviewing the risks                            and benefits, the patient was deemed in                            satisfactory condition to undergo the procedure.                           After obtaining informed consent, the colonoscope  was passed under direct vision. Throughout the                            procedure, the patient's blood pressure, pulse, and                            oxygen saturations were monitored continuously. The                            Colonoscope was introduced through the anus and                            advanced to the the cecum, identified by                            appendiceal orifice and ileocecal  valve. The                            ileocecal valve, appendiceal orifice, and rectum                            were photographed. The quality of the bowel                            preparation was excellent. The colonoscopy was                            performed without difficulty. The patient tolerated                            the procedure well. Scope In: 10:49:34 AM Scope Out: 11:03:08 AM Scope Withdrawal Time: 0 hours 11 minutes 35 seconds  Total Procedure Duration: 0 hours 13 minutes 34 seconds  Findings:                 The perianal and digital rectal examinations were                            normal.                           A 6 mm polyp was found in the sigmoid colon. The                            polyp was sessile. The polyp was removed with a                            cold snare. Resection and retrieval were complete.                           A few small-mouthed diverticula were found in the                            sigmoid colon. There was no evidence of  diverticular bleeding.                           Internal hemorrhoids were found during                            retroflexion. The hemorrhoids were small and Grade                            I (internal hemorrhoids that do not prolapse).                           The exam was otherwise without abnormality on                            direct and retroflexion views. Complications:            No immediate complications. Estimated blood loss:                            None. Estimated Blood Loss:     Estimated blood loss: none. Impression:               - One 6 mm polyp in the sigmoid colon, removed with                            a cold snare. Resected and retrieved.                           - Mild diverticulosis in the sigmoid colon. There                            was no evidence of diverticular bleeding.                           - Internal hemorrhoids.                            - The examination was otherwise normal on direct                            and retroflexion views. Recommendation:           - Repeat colonoscopy in 5 years for                            screening/surveillance.                           - Patient has a contact number available for                            emergencies. The signs and symptoms of potential                            delayed complications were discussed with the  patient. Return to normal activities tomorrow.                            Written discharge instructions were provided to the                            patient.                           - Resume previous diet.                           - Continue present medications.                           - Await pathology results. Ladene Artist, MD 02/13/2017 11:07:25 AM This report has been signed electronically.

## 2017-02-13 NOTE — Progress Notes (Signed)
Report given to PACU, vss 

## 2017-02-13 NOTE — Progress Notes (Signed)
Called to room to assist during endoscopic procedure.  Patient ID and intended procedure confirmed with present staff. Received instructions for my participation in the procedure from the performing physician.  

## 2017-02-14 ENCOUNTER — Telehealth: Payer: Self-pay | Admitting: *Deleted

## 2017-02-14 NOTE — Telephone Encounter (Signed)
  Follow up Call-  Call back number 02/13/2017 02/04/2016  Post procedure Call Back phone  # [phone]  Permission to leave phone message Yes Yes  Some recent data might be hidden     Patient questions:  Do you have a fever, pain , or abdominal swelling? No. Pain Score  0 *  Have you tolerated food without any problems? Yes.    Have you been able to return to your normal activities? Yes.    Do you have any questions about your discharge instructions: Diet   No. Medications  No. Follow up visit  No.  Do you have questions or concerns about your Care? No.  Actions: * If pain score is 4 or above: No action needed, pain <4.

## 2017-02-15 ENCOUNTER — Encounter: Payer: 59 | Admitting: Gastroenterology

## 2017-02-28 ENCOUNTER — Encounter: Payer: Self-pay | Admitting: Gastroenterology

## 2017-03-01 ENCOUNTER — Encounter: Payer: 59 | Admitting: Gastroenterology

## 2017-04-12 DIAGNOSIS — Z01419 Encounter for gynecological examination (general) (routine) without abnormal findings: Secondary | ICD-10-CM | POA: Diagnosis not present

## 2017-04-12 DIAGNOSIS — Z6826 Body mass index (BMI) 26.0-26.9, adult: Secondary | ICD-10-CM | POA: Diagnosis not present

## 2017-10-03 ENCOUNTER — Ambulatory Visit: Payer: 59 | Admitting: Gastroenterology

## 2017-10-17 DIAGNOSIS — I10 Essential (primary) hypertension: Secondary | ICD-10-CM | POA: Diagnosis not present

## 2017-10-17 DIAGNOSIS — Z0000 Encounter for general adult medical examination without abnormal findings: Secondary | ICD-10-CM | POA: Diagnosis not present

## 2017-10-20 DIAGNOSIS — M25531 Pain in right wrist: Secondary | ICD-10-CM | POA: Diagnosis not present

## 2017-12-11 DIAGNOSIS — Z23 Encounter for immunization: Secondary | ICD-10-CM | POA: Diagnosis not present

## 2018-01-12 ENCOUNTER — Other Ambulatory Visit: Payer: Self-pay | Admitting: Obstetrics and Gynecology

## 2018-01-12 DIAGNOSIS — Z1231 Encounter for screening mammogram for malignant neoplasm of breast: Secondary | ICD-10-CM

## 2018-01-18 DIAGNOSIS — M722 Plantar fascial fibromatosis: Secondary | ICD-10-CM | POA: Diagnosis not present

## 2018-01-18 DIAGNOSIS — M25572 Pain in left ankle and joints of left foot: Secondary | ICD-10-CM | POA: Diagnosis not present

## 2018-02-19 ENCOUNTER — Ambulatory Visit
Admission: RE | Admit: 2018-02-19 | Discharge: 2018-02-19 | Disposition: A | Discharge status: Home / Self Care | Payer: 59 | Source: Ambulatory Visit | Attending: Obstetrics and Gynecology | Admitting: Obstetrics and Gynecology

## 2018-02-19 DIAGNOSIS — Z1231 Encounter for screening mammogram for malignant neoplasm of breast: Secondary | ICD-10-CM

## 2018-04-19 DIAGNOSIS — Z01419 Encounter for gynecological examination (general) (routine) without abnormal findings: Secondary | ICD-10-CM | POA: Diagnosis not present

## 2018-04-19 DIAGNOSIS — Z6827 Body mass index (BMI) 27.0-27.9, adult: Secondary | ICD-10-CM | POA: Diagnosis not present

## 2018-04-30 ENCOUNTER — Other Ambulatory Visit: Payer: Self-pay | Admitting: Obstetrics and Gynecology

## 2018-04-30 DIAGNOSIS — Z803 Family history of malignant neoplasm of breast: Secondary | ICD-10-CM

## 2018-05-22 ENCOUNTER — Other Ambulatory Visit: Payer: 59

## 2018-07-16 ENCOUNTER — Other Ambulatory Visit: Payer: 59

## 2018-08-06 ENCOUNTER — Other Ambulatory Visit: Payer: 59

## 2018-12-10 ENCOUNTER — Other Ambulatory Visit: Payer: 59

## 2018-12-13 ENCOUNTER — Other Ambulatory Visit: Payer: Self-pay | Admitting: Family Medicine

## 2018-12-13 DIAGNOSIS — Z8271 Family history of polycystic kidney: Secondary | ICD-10-CM

## 2018-12-18 ENCOUNTER — Ambulatory Visit
Admission: RE | Admit: 2018-12-18 | Discharge: 2018-12-18 | Disposition: A | Discharge status: Home / Self Care | Payer: 59 | Source: Ambulatory Visit | Attending: Family Medicine | Admitting: Family Medicine

## 2018-12-18 DIAGNOSIS — Z8271 Family history of polycystic kidney: Secondary | ICD-10-CM

## 2019-01-03 ENCOUNTER — Other Ambulatory Visit: Payer: 59

## 2019-01-24 ENCOUNTER — Other Ambulatory Visit: Payer: Self-pay

## 2019-01-24 ENCOUNTER — Ambulatory Visit
Admission: RE | Admit: 2019-01-24 | Discharge: 2019-01-24 | Disposition: A | Discharge status: Home / Self Care | Payer: 59 | Source: Ambulatory Visit | Attending: Obstetrics and Gynecology | Admitting: Obstetrics and Gynecology

## 2019-01-24 DIAGNOSIS — Z803 Family history of malignant neoplasm of breast: Secondary | ICD-10-CM

## 2019-01-24 MED ORDER — GADOBUTROL 1 MMOL/ML IV SOLN
6.0000 mL | Freq: Once | INTRAVENOUS | Status: AC | PRN
  Administered 2019-01-24: 6 mL via INTRAVENOUS

## 2019-01-25 ENCOUNTER — Other Ambulatory Visit: Payer: Self-pay

## 2019-01-25 DIAGNOSIS — Z20822 Contact with and (suspected) exposure to covid-19: Secondary | ICD-10-CM

## 2019-01-26 LAB — NOVEL CORONAVIRUS, NAA: SARS-CoV-2, NAA: NOT DETECTED

## 2019-03-19 ENCOUNTER — Other Ambulatory Visit: Payer: 59

## 2019-03-20 ENCOUNTER — Other Ambulatory Visit: Payer: 59

## 2019-03-25 ENCOUNTER — Other Ambulatory Visit: Payer: Self-pay | Admitting: Obstetrics and Gynecology

## 2019-03-25 DIAGNOSIS — Z1231 Encounter for screening mammogram for malignant neoplasm of breast: Secondary | ICD-10-CM

## 2019-04-30 ENCOUNTER — Other Ambulatory Visit: Payer: 59

## 2019-04-30 ENCOUNTER — Ambulatory Visit: Payer: 59

## 2019-05-14 ENCOUNTER — Ambulatory Visit: Payer: 59

## 2019-07-11 ENCOUNTER — Ambulatory Visit: Admission: RE | Admit: 2019-07-11 | Payer: 59 | Source: Ambulatory Visit

## 2019-07-11 ENCOUNTER — Ambulatory Visit
Admission: RE | Admit: 2019-07-11 | Discharge: 2019-07-11 | Disposition: A | Discharge status: Home / Self Care | Payer: 59 | Source: Ambulatory Visit | Attending: Obstetrics and Gynecology | Admitting: Obstetrics and Gynecology

## 2019-07-11 ENCOUNTER — Other Ambulatory Visit: Payer: Self-pay

## 2019-07-11 ENCOUNTER — Other Ambulatory Visit: Payer: Self-pay | Admitting: Obstetrics and Gynecology

## 2019-07-11 DIAGNOSIS — N63 Unspecified lump in unspecified breast: Secondary | ICD-10-CM

## 2019-07-11 DIAGNOSIS — Z1231 Encounter for screening mammogram for malignant neoplasm of breast: Secondary | ICD-10-CM

## 2020-04-02 ENCOUNTER — Telehealth: Payer: 59 | Admitting: Orthopedic Surgery

## 2020-04-02 DIAGNOSIS — R21 Rash and other nonspecific skin eruption: Secondary | ICD-10-CM

## 2020-04-02 MED ORDER — DOXYCYCLINE HYCLATE 100 MG PO TABS: 100.0000 mg | ORAL_TABLET | Freq: Two times a day (BID) | ORAL | 0 refills | Status: AC

## 2020-04-02 NOTE — Progress Notes (Signed)
E Visit for Rash  We are sorry that you are not feeling well. Here is how we plan to help!  It looks like this could be perioral dermatitis, though rosacea can appear this way as well. I will prescribe doxycycline 100mg  bid for 7 days since the steroids have not worked. Please stop using the hydrocortisone.   HOME CARE:   Take cool showers and avoid direct sunlight.  Apply cool compress or wet dressings.  Take an antihistamine like Benadryl for widespread rashes that itch.  The adult dose of Benadryl is 25-50 mg by mouth 4 times daily.  Caution:  This type of medication may cause sleepiness.  Do not drink alcohol, drive, or operate dangerous machinery while taking antihistamines.  Do not take these medications if you have prostate enlargement.  Read package instructions thoroughly on all medications that you take.  GET HELP RIGHT AWAY IF:   Symptoms don't go away after treatment.  Severe itching that persists.  If you rash spreads or swells.  If you rash begins to smell.  If it blisters and opens or develops a yellow-brown crust.  You develop a fever.  You have a sore throat.  You become short of breath.  MAKE SURE YOU:  Understand these instructions. Will watch your condition. Will get help right away if you are not doing well or get worse.  Thank you for choosing an e-visit. Your e-visit answers were reviewed by a board certified advanced clinical practitioner to complete your personal care plan. Depending upon the condition, your plan could have included both over the counter or prescription medications. Please review your pharmacy choice. Be sure that the pharmacy you have chosen is open so that you can pick up your prescription now.  If there is a problem you may message your provider in MyChart to have the prescription routed to another pharmacy. Your safety is important to . If you have drug allergies check your prescription carefully.  For the next 24 hours,  you can use MyChart to ask questions about today's visit, request a non-urgent call back, or ask for a work or school excuse from your e-visit provider. You will get an email in the next two days asking about your experience. I hope that your e-visit has been valuable and will speed your recovery.   Greater than 5 minutes, yet less than 10 minutes of time have been spent researching, coordinating and implementing care for this patient today.

## 2020-04-03 MED ORDER — METRONIDAZOLE 0.75 % EX LOTN: TOPICAL_LOTION | CUTANEOUS | 0 refills | Status: DC

## 2020-04-03 NOTE — Addendum Note (Signed)
Addended by: Mercer Pod on: 04/03/2020 08:22 AM   Modules accepted: Orders

## 2020-05-26 ENCOUNTER — Other Ambulatory Visit: Payer: Self-pay | Admitting: Obstetrics and Gynecology

## 2020-05-26 DIAGNOSIS — Z1231 Encounter for screening mammogram for malignant neoplasm of breast: Secondary | ICD-10-CM

## 2020-06-16 DIAGNOSIS — J302 Other seasonal allergic rhinitis: Secondary | ICD-10-CM | POA: Insufficient documentation

## 2020-07-20 ENCOUNTER — Other Ambulatory Visit: Payer: Self-pay

## 2020-07-20 ENCOUNTER — Ambulatory Visit
Admission: RE | Admit: 2020-07-20 | Discharge: 2020-07-20 | Disposition: A | Discharge status: Home / Self Care | Payer: 59 | Source: Ambulatory Visit | Attending: Obstetrics and Gynecology | Admitting: Obstetrics and Gynecology

## 2020-07-20 DIAGNOSIS — Z1231 Encounter for screening mammogram for malignant neoplasm of breast: Secondary | ICD-10-CM

## 2021-01-16 ENCOUNTER — Emergency Department (HOSPITAL_BASED_OUTPATIENT_CLINIC_OR_DEPARTMENT_OTHER): Payer: 59

## 2021-01-16 ENCOUNTER — Other Ambulatory Visit: Payer: Self-pay

## 2021-01-16 ENCOUNTER — Emergency Department (HOSPITAL_BASED_OUTPATIENT_CLINIC_OR_DEPARTMENT_OTHER)
Admission: EM | Admit: 2021-01-16 | Discharge: 2021-01-16 | Disposition: A | Discharge status: Home / Self Care | Payer: 59 | Attending: Emergency Medicine | Admitting: Emergency Medicine

## 2021-01-16 ENCOUNTER — Encounter (HOSPITAL_BASED_OUTPATIENT_CLINIC_OR_DEPARTMENT_OTHER): Payer: Self-pay

## 2021-01-16 DIAGNOSIS — Z91040 Latex allergy status: Secondary | ICD-10-CM | POA: Insufficient documentation

## 2021-01-16 DIAGNOSIS — I10 Essential (primary) hypertension: Secondary | ICD-10-CM | POA: Diagnosis not present

## 2021-01-16 DIAGNOSIS — K5792 Diverticulitis of intestine, part unspecified, without perforation or abscess without bleeding: Secondary | ICD-10-CM | POA: Insufficient documentation

## 2021-01-16 DIAGNOSIS — R1032 Left lower quadrant pain: Secondary | ICD-10-CM | POA: Diagnosis present

## 2021-01-16 LAB — COMPREHENSIVE METABOLIC PANEL
ALT: 57 U/L — ABNORMAL HIGH (ref 0–44)
AST: 24 U/L (ref 15–41)
Albumin: 4.5 g/dL (ref 3.5–5.0)
Alkaline Phosphatase: 59 U/L (ref 38–126)
Anion gap: 9 (ref 5–15)
BUN: 12 mg/dL (ref 6–20)
CO2: 30 mmol/L (ref 22–32)
Calcium: 9.6 mg/dL (ref 8.9–10.3)
Chloride: 99 mmol/L (ref 98–111)
Creatinine, Ser: 0.72 mg/dL (ref 0.44–1.00)
GFR, Estimated: >60 mL/min (ref >60)
Glucose, Bld: 114 mg/dL — ABNORMAL HIGH (ref 70–99)
Potassium: 3.4 mmol/L — ABNORMAL LOW (ref 3.5–5.1)
Sodium: 138 mmol/L (ref 135–145)
Total Bilirubin: 1 mg/dL (ref 0.3–1.2)
Total Protein: 7 g/dL (ref 6.5–8.1)

## 2021-01-16 LAB — URINALYSIS, ROUTINE W REFLEX MICROSCOPIC
Bilirubin Urine: NEGATIVE
Glucose, UA: NEGATIVE mg/dL
Hgb urine dipstick: NEGATIVE
Ketones, ur: NEGATIVE mg/dL
Leukocytes,Ua: NEGATIVE
Nitrite: NEGATIVE
Specific Gravity, Urine: >1.046 — ABNORMAL HIGH (ref 1.005–1.030)
pH: 6.5 (ref 5.0–8.0)

## 2021-01-16 LAB — CBC
HCT: 37.3 % (ref 36.0–46.0)
Hemoglobin: 13.1 g/dL (ref 12.0–15.0)
MCH: 31.1 pg (ref 26.0–34.0)
MCHC: 35.1 g/dL (ref 30.0–36.0)
MCV: 88.6 fL (ref 80.0–100.0)
Platelets: 242 10*3/uL (ref 150–400)
RBC: 4.21 MIL/uL (ref 3.87–5.11)
RDW: 12 % (ref 11.5–15.5)
WBC: 9.5 10*3/uL (ref 4.0–10.5)
nRBC: 0 % (ref 0.0–0.2)

## 2021-01-16 MED ORDER — IOHEXOL 300 MG/ML  SOLN
100.0000 mL | Freq: Once | INTRAMUSCULAR | Status: AC | PRN
  Administered 2021-01-16: 100 mL via INTRAVENOUS

## 2021-01-16 MED ORDER — AMOXICILLIN-POT CLAVULANATE 875-125 MG PO TABS: 1.0000 | ORAL_TABLET | Freq: Two times a day (BID) | ORAL | 0 refills | Status: DC

## 2021-01-16 NOTE — ED Provider Notes (Signed)
[facility] Provider Note   CSN: [id number] Arrival date & time: 01/16/21  7616     History Chief Complaint  Patient presents with   Abdominal Pain    Jillian Patton is a 55 y.o. female.  Patient presents with left-sided abdominal pain ongoing for the past 4 days.  Describes an ache and persistent pain in the left lower quadrant otherwise nonradiating.  She had chills 2 days ago but no recorded fevers.  Denies any vomiting or cough.  Denies diarrhea.  She states at rest the pain is not as severe but when she pushes on her left lower quadrant and becomes more intense.      Past Medical History:  Diagnosis Date   Allergy    Anxiety    GERD (gastroesophageal reflux disease)    Hypertension     Patient Active Problem List   Diagnosis Date Noted   DIARRHEA 10/23/2007    Past Surgical History:  Procedure Laterality Date   APPENDECTOMY     CHOLECYSTECTOMY     COLONOSCOPY       OB History   No obstetric history on file.     Family History  Problem Relation Age of Onset   Hypertension Mother    Colon polyps Mother    Breast cancer Mother        32's   Hypertension Father    Colon polyps Father    Colon cancer Neg Hx    Esophageal cancer Neg Hx    Pancreatic cancer Neg Hx    Rectal cancer Neg Hx    Stomach cancer Neg Hx     Social History   Tobacco Use   Smoking status: Never   Smokeless tobacco: Never  Substance Use Topics   Alcohol use: Yes    Alcohol/week: 4.0 standard drinks    Types: 4 Standard drinks or equivalent per week   Drug use: No    Home Medications Prior to Admission medications   Medication Sig Start Date End Date Taking? Authorizing Provider  amoxicillin-clavulanate (AUGMENTIN) 875-125 MG tablet Take 1 tablet by mouth every 12 (twelve) hours. 01/16/21  Yes Elzada Pytel, Greggory Brandy, MD  ALPRAZolam Duanne Moron) 0.5 MG tablet Take 0.5 mg by mouth at bedtime as needed for anxiety.    [provider]  Calcium  Citrate (CITRACAL PO) Take 1 tablet by mouth daily.     [provider]  cetirizine (ZYRTEC) 10 MG tablet Take 10 mg by mouth daily.    [provider]  cholecalciferol (VITAMIN D) 1000 UNITS tablet Take 1,000 Units by mouth daily.    [provider]  losartan-hydrochlorothiazide (HYZAAR) 50-12.5 MG tablet Take 1 tablet by mouth daily.    [provider]  METRONIDAZOLE, TOPICAL, 0.75 % LOTN Apply and rub a thin film twice daily to affected areas after washing 04/03/20   McVey, Gelene Mink, PA-C  montelukast (SINGULAIR) 10 MG tablet Take 10 mg by mouth at bedtime.    [provider]  Multiple Vitamin (MULTI-DAY PO) Take by mouth.    [provider]    Allergies    Sulfamethoxazole-trimethoprim and Latex  Review of Systems   Review of Systems  Constitutional:  Negative for fever.  HENT:  Negative for ear pain.   Eyes:  Negative for pain.  Respiratory:  Negative for cough.   Cardiovascular:  Negative for chest pain.  Gastrointestinal:  Positive for abdominal pain.  Genitourinary:  Negative for flank pain.  Musculoskeletal:  Negative  for back pain.  Skin:  Negative for rash.  Neurological:  Negative for headaches.   Physical Exam Updated Vital Signs BP 125/79   Pulse 67   Temp 98 F (36.7 C) (Oral)   Resp 18   LMP 02/18/2012   SpO2 98%   Physical Exam Constitutional:      General: She is not in acute distress.    Appearance: Normal appearance.  HENT:     Head: Normocephalic.     Nose: Nose normal.  Eyes:     Extraocular Movements: Extraocular movements intact.  Cardiovascular:     Rate and Rhythm: Normal rate.  Pulmonary:     Effort: Pulmonary effort is normal.  Abdominal:     Comments: Mild tenderness in the left lower quadrant/left mid quadrant region.  No guarding or rebound noted.  Musculoskeletal:        General: Normal range of motion.     Cervical back: Normal range of motion.  Neurological:      General: No focal deficit present.     Mental Status: She is alert. Mental status is at baseline.    ED Results / Procedures / Treatments   Labs (all labs ordered are listed, but only abnormal results are displayed) Labs Reviewed  COMPREHENSIVE METABOLIC PANEL - Abnormal; Notable for the following components:      Result Value   Potassium 3.4 (*)    Glucose, Bld 114 (*)    ALT 57 (*)    All other components within normal limits  URINALYSIS, ROUTINE W REFLEX MICROSCOPIC - Abnormal; Notable for the following components:   Specific Gravity, Urine >1.046 (*)    Protein, ur TRACE (*)    All other components within normal limits  CBC    EKG None  Radiology CT Abdomen Pelvis W Contrast  Result Date: 01/16/2021 CLINICAL DATA:  55 year old female left-sided abdominal pain. EXAM: CT ABDOMEN AND PELVIS WITHOUT CONTRAST TECHNIQUE: Multidetector CT imaging of the abdomen and pelvis was performed following the standard protocol without IV contrast. COMPARISON:  CT abdomen from 01/18/2017, CT abdomen pelvis from 04/18/2007 FINDINGS: Lower chest: Bibasilar subsegmental atelectasis. The heart is normal size. No pericardial effusion. Hepatobiliary: No focal liver abnormality is seen. Status post cholecystectomy. No biliary dilatation. Pancreas: Unremarkable. No pancreatic ductal dilatation or surrounding inflammatory changes. Spleen: Normal in size without focal abnormality. Adrenals/Urinary Tract: Adrenal glands are unremarkable. Similar appearing partially exophytic right superior pole simple renal cyst measuring up to 2.7 cm in greatest craniocaudal dimension. Kidneys are normal, without renal calculi, focal lesion, or hydronephrosis. Bladder is unremarkable. Stomach/Bowel: Stomach is within normal limits. The appendix is surgically absent. There are few scattered sigmoid diverticula with associated bowel wall thickening and surrounding mesenteric fat stranding about the proximal sigmoid colon. No  evidence of surrounding fluid collection or evidence of bowel perforation. Vascular/Lymphatic: No significant vascular findings are present. No enlarged abdominal or pelvic lymph nodes. Reproductive: Left uterine fundal mass measuring up to approximately 3.9 x 3.6 x 3.7 cm, mildly hypoenhancing compared to surrounding myometrium. No adnexal masses. Other: No abdominal wall hernia or abnormality. No abdominopelvic ascites. Musculoskeletal: No acute or significant osseous findings. IMPRESSION: 1. Acute uncomplicated sigmoid diverticulitis. 2. Bibasilar subsegmental atelectasis. 3. 3.9 cm mildly hypoenhancing mass in the left uterine fundus, favored represent uterine leiomyoma. Nonemergent pelvic ultrasound could be considered for further evaluation Ruthann Cancer, MD [facility] Electronically Signed   By: Ruthann Cancer M.D.   On: 01/16/2021 11:42  Procedures Procedures   Medications Ordered in ED Medications  iohexol (OMNIPAQUE) 300 MG/ML solution 100 mL (100 mLs Intravenous Contrast Given 01/16/21 1109)    ED Course  I have reviewed the triage vital signs and the nursing notes.  Pertinent labs & imaging results that were available during my care of the patient were reviewed by me and considered in my medical decision making (see chart for details).    MDM Rules/Calculators/A&P                           Labs unremarkable chemistry normal.  Urinalysis shows no evidence of infection.  CT abdomen pelvis pursued.  Consistent with uncomplicated diverticulitis.  Patient given prescription of antibiotic medications and discharged home to follow-up with her doctor within the week.  Advised return for fevers worsening pain vomiting or any additional concerns.  Final Clinical Impression(s) / ED Diagnoses Final diagnoses:  Diverticulitis    Rx / DC Orders ED Discharge Orders          Ordered    amoxicillin-clavulanate (AUGMENTIN)  875-125 MG tablet  Every 12 hours        01/16/21 1148             Luna Fuse, MD 01/16/21 1148

## 2021-01-16 NOTE — ED Triage Notes (Signed)
She c/o left-sided abd. Discomfort since this Tues. She tells [place] she underwnet colonoscopy by Dr. Fuller Plan ~ 2 years ago, which showed "diverticulosis". She is ambulatory and in no distress.

## 2021-01-16 NOTE — ED Notes (Signed)
Pt is aware we need a urine specimen. Call bell within reach.

## 2021-01-16 NOTE — Discharge Instructions (Signed)
Call your primary care doctor or specialist as discussed in the next 2-3 days.   Return immediately back to the ER if:  Your symptoms worsen within the next 12-24 hours. You develop new symptoms such as new fevers, persistent vomiting, new pain, shortness of breath, or new weakness or numbness, or if you have any other concerns.  

## 2021-01-26 DIAGNOSIS — R7303 Prediabetes: Secondary | ICD-10-CM | POA: Insufficient documentation

## 2021-02-09 ENCOUNTER — Encounter: Payer: Self-pay | Admitting: Nurse Practitioner

## 2021-02-09 ENCOUNTER — Ambulatory Visit (INDEPENDENT_AMBULATORY_CARE_PROVIDER_SITE_OTHER): Payer: 59 | Admitting: Nurse Practitioner

## 2021-02-09 VITALS — BP 116/70 | HR 83 | Ht 63.0 in | Wt 171.6 lb

## 2021-02-09 DIAGNOSIS — K5792 Diverticulitis of intestine, part unspecified, without perforation or abscess without bleeding: Secondary | ICD-10-CM

## 2021-02-09 NOTE — Progress Notes (Signed)
ASSESSMENT AND PLAN    #55 year old female with recent episode of uncomplicated sigmoid diverticulitis found on CTAP in ED. total resolution of LLQ pain with course of Augmentin --Recommended high-fiber diet --In the event of future episodes of LLQ pain advised patient that she should start herself on clear liquid diet and contact the office -- Her last colonoscopy was in 2018 and she is due for another November of next year  # [facility] of precancerous polyps in both parents in their early 31's. Patient has no personal history of adenomatous colon polyps. She is due for a 5 year colonoscopy in November 2023.   # Uterine leiomyoma on CTAP. We briefly discussed and she will talk with Dr. Helane Rima,  her GYN , to make sure no further work-up is warranted  HISTORY OF PRESENT ILLNESS     Chief Complaint : Recent diverticulitis  Jillian Patton is a 55 y.o. female with a past medical history significant for diverticulosis, chronic diarrhea, GERD, appendectomy, cholecystectomy. See PMH below for any additional history.   Patient is known to Dr. Fuller Plan, last seen 2018 at time of colonoscopy.    Jillian Patton was in the ED October 22 for evaluation of left lower quadrant pain and chills.  WBC was normal at 9.5, hemoglobin normal at 13.1 . CT scan showed uncomplicated diverticulitis   CTAP w/ contrast 1. Acute uncomplicated sigmoid diverticulitis. 2. Bibasilar subsegmental atelectasis. 3. 3.9 cm mildly hypoenhancing mass in the left uterine fundus, favored represent uterine leiomyoma. Nonemergent pelvic ultrasound could be considered for further evaluation  She is due for a 5 year colonoscopy in November 2023  PREVIOUS GI EVALUATIONS:   Last colonoscopy  Colonoscopy 2018 for evaluation of abnormal CT scan -- 6 mm sessile polyp removed from the sigmoid colon.  A few sigmoid diverticula, internal hemorrhoids.  Polyp was not adenomatous but rather benign mucosa.  Repeat colonoscopy recommended in 5  years    Past Medical History:  Diagnosis Date   Allergy    Anxiety    GERD (gastroesophageal reflux disease)    History of diverticulitis    Hypertension      Past Surgical History:  Procedure Laterality Date   APPENDECTOMY     CHOLECYSTECTOMY     COLONOSCOPY     Family History  Problem Relation Age of Onset   Hypertension Mother    Colon polyps Mother    Breast cancer Mother        69's   Hypertension Father    Colon polyps Father    Colon cancer Neg Hx    Esophageal cancer Neg Hx    Pancreatic cancer Neg Hx    Rectal cancer Neg Hx    Stomach cancer Neg Hx    Social History   Tobacco Use   Smoking status: Never   Smokeless tobacco: Never  Substance Use Topics   Alcohol use: Yes    Alcohol/week: 4.0 standard drinks    Types: 4 Standard drinks or equivalent per week   Drug use: No   Current Outpatient Medications  Medication Sig Dispense Refill   ALPRAZolam (XANAX) 0.5 MG tablet Take 0.5 mg by mouth at bedtime as needed for anxiety.     Calcium Citrate (CITRACAL PO) Take 1 tablet by mouth daily.      cetirizine (ZYRTEC) 10 MG tablet Take 10 mg by mouth daily.     cholecalciferol (VITAMIN D) 1000 UNITS tablet Take 1,000 Units by mouth daily.  escitalopram (LEXAPRO) 20 MG tablet Take 1 tablet by mouth daily.     METRONIDAZOLE, TOPICAL, 0.75 % LOTN Apply and rub a thin film twice daily to affected areas after washing 59 mL 0   montelukast (SINGULAIR) 10 MG tablet Take 10 mg by mouth at bedtime.     MOUNJARO 2.5 MG/0.5ML Pen Inject 2.5 mg into the skin once a week.     Multiple Vitamin (MULTI-DAY PO) Take by mouth.     olmesartan-hydrochlorothiazide (BENICAR HCT) 40-25 MG tablet Take 1 tablet by mouth daily.     No current facility-administered medications for this visit.   Allergies  Allergen Reactions   Latex Rash   Sulfa Antibiotics     Other reaction(s): rash    ROS:  All systems reviewed and negative except where noted in HPI.    PHYSICAL  EXAM :    Wt Readings from Last 3 Encounters:  02/09/21 171 lb 9.6 oz (77.8 kg)  02/13/17 155 lb (70.3 kg)  01/20/17 158 lb 9.6 oz (71.9 kg)    BP 116/70   Pulse 83   Ht 5\' 3"  (1.6 m)   Wt 171 lb 9.6 oz (77.8 kg)   LMP 02/18/2012   BMI 30.40 kg/m  Constitutional:  Generally well appearing female in no acute distress. Psychiatric: Pleasant. Normal mood and affect. Behavior is normal. EENT: Pupils normal.  Conjunctivae are normal. No scleral icterus. Neck supple.  Cardiovascular: Normal rate, regular rhythm. No edema Pulmonary/chest: Effort normal and breath sounds normal. No wheezing, rales or rhonchi. Abdominal: Soft, nondistended, nontender. Bowel sounds active throughout. There are no masses palpable. No hepatomegaly. Neurological: Alert and oriented to person place and time. Skin: Skin is warm and dry. No rashes noted.  Tye Savoy, NP  02/09/2021, 10:14 AM

## 2021-02-09 NOTE — Progress Notes (Signed)
Reviewed and agree with management plan.  Krystena Reitter T. Jalyssa Fleisher, MD FACG 

## 2021-02-09 NOTE — Patient Instructions (Signed)
If you are age 54 or younger, your body mass index should be between 19-25. Your Body mass index is 30.4 kg/m. If this is out of the aformentioned range listed, please consider follow up with your Primary Care Provider.  ________________________________________________________  The [facility] providers would like to encourage you to use [facility] to communicate with providers for non-urgent requests or questions.  Due to long hold times on the telephone, sending your provider a message by [facility] may be a faster and more efficient way to get a response.  Please allow 48 business hours for a response.  Please remember that this is for non-urgent requests.  _______________________________________________________  Call the office if you have any recurrent pain or symptoms.  Thank you for entrusting me with your care and choosing [facility].  Tye Savoy, NP-C

## 2021-06-23 ENCOUNTER — Other Ambulatory Visit: Payer: Self-pay | Admitting: Obstetrics and Gynecology

## 2021-06-23 DIAGNOSIS — Z1231 Encounter for screening mammogram for malignant neoplasm of breast: Secondary | ICD-10-CM

## 2021-07-01 ENCOUNTER — Other Ambulatory Visit: Payer: Self-pay

## 2021-07-01 MED ORDER — MOUNJARO 10 MG/0.5ML ~~LOC~~ SOAJ
10.0000 mg | SUBCUTANEOUS | 4 refills | Status: DC
  Filled 2021-07-01: qty 2, 28d supply, fill #0

## 2021-07-12 ENCOUNTER — Other Ambulatory Visit: Payer: Self-pay

## 2021-07-21 ENCOUNTER — Ambulatory Visit
Admission: RE | Admit: 2021-07-21 | Discharge: 2021-07-21 | Disposition: A | Discharge status: Home / Self Care | Payer: 59 | Source: Ambulatory Visit | Attending: Obstetrics and Gynecology | Admitting: Obstetrics and Gynecology

## 2021-07-21 DIAGNOSIS — Z1231 Encounter for screening mammogram for malignant neoplasm of breast: Secondary | ICD-10-CM

## 2021-07-22 ENCOUNTER — Other Ambulatory Visit: Payer: Self-pay | Admitting: Family Medicine

## 2021-07-22 DIAGNOSIS — R928 Other abnormal and inconclusive findings on diagnostic imaging of breast: Secondary | ICD-10-CM

## 2021-07-27 ENCOUNTER — Ambulatory Visit
Admission: RE | Admit: 2021-07-27 | Discharge: 2021-07-27 | Disposition: A | Discharge status: Home / Self Care | Payer: 59 | Source: Ambulatory Visit | Attending: Family Medicine | Admitting: Family Medicine

## 2021-07-27 DIAGNOSIS — R928 Other abnormal and inconclusive findings on diagnostic imaging of breast: Secondary | ICD-10-CM

## 2021-07-29 ENCOUNTER — Telehealth: Payer: Self-pay | Admitting: Nurse Practitioner

## 2021-07-29 NOTE — Telephone Encounter (Signed)
Patient called requesting augmentin refill.  ?

## 2021-07-29 NOTE — Telephone Encounter (Signed)
Spoke with pt and she stated that yesterday morning when she woke up her abd was sore to the touch. Pt reports pain is in LLQ and feels the same as the last time she had diverticulitis. Pt denies fever, nausea, and vomiting. Pt does report she has had some constipation and diarrhea. Pt requesting augmentin.  ?

## 2021-07-30 ENCOUNTER — Other Ambulatory Visit: Payer: Self-pay

## 2021-07-30 MED ORDER — AMOXICILLIN-POT CLAVULANATE 875-125 MG PO TABS: 1.0000 | ORAL_TABLET | Freq: Two times a day (BID) | ORAL | 0 refills | Status: DC

## 2021-07-30 NOTE — Telephone Encounter (Signed)
Called and spoke with Jillian Patton . Prescription sent to Jillian Patton's pharmacy.  Follow up appt scheduled with Nevin Bloodgood on Friday 5/19 at 9:00 am. Jillian Patton verbalized understanding and is aware she should call back if not getting better.   ?

## 2021-07-30 NOTE — Telephone Encounter (Signed)
Patient called to follow up regarding augmentin. Please advise. ?

## 2021-08-13 ENCOUNTER — Ambulatory Visit: Payer: 59 | Admitting: Nurse Practitioner

## 2022-01-02 IMAGING — MG MM DIGITAL SCREENING BILAT W/ TOMO AND CAD
6 of 10 series · 6 of 30 positions shown · non-contrast
Comparison: Previous exam(s).

CLINICAL DATA: Screening.

EXAM:
DIGITAL SCREENING BILATERAL MAMMOGRAM WITH TOMOSYNTHESIS AND CAD
TECHNIQUE: Bilateral screening digital craniocaudal and mediolateral oblique
mammograms were obtained. Bilateral screening digital breast
tomosynthesis was performed. The images were evaluated with
computer-aided detection.

[R CC synth-2D (1 of 2)]
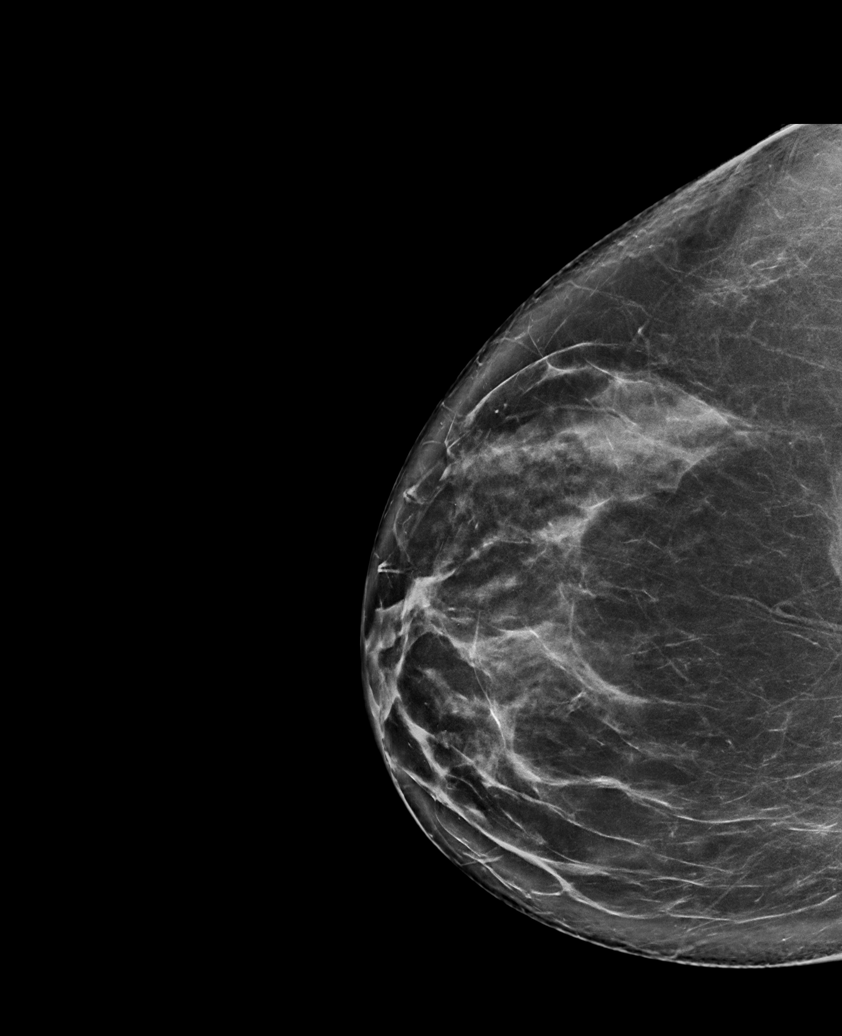

[R MLO synth-2D]
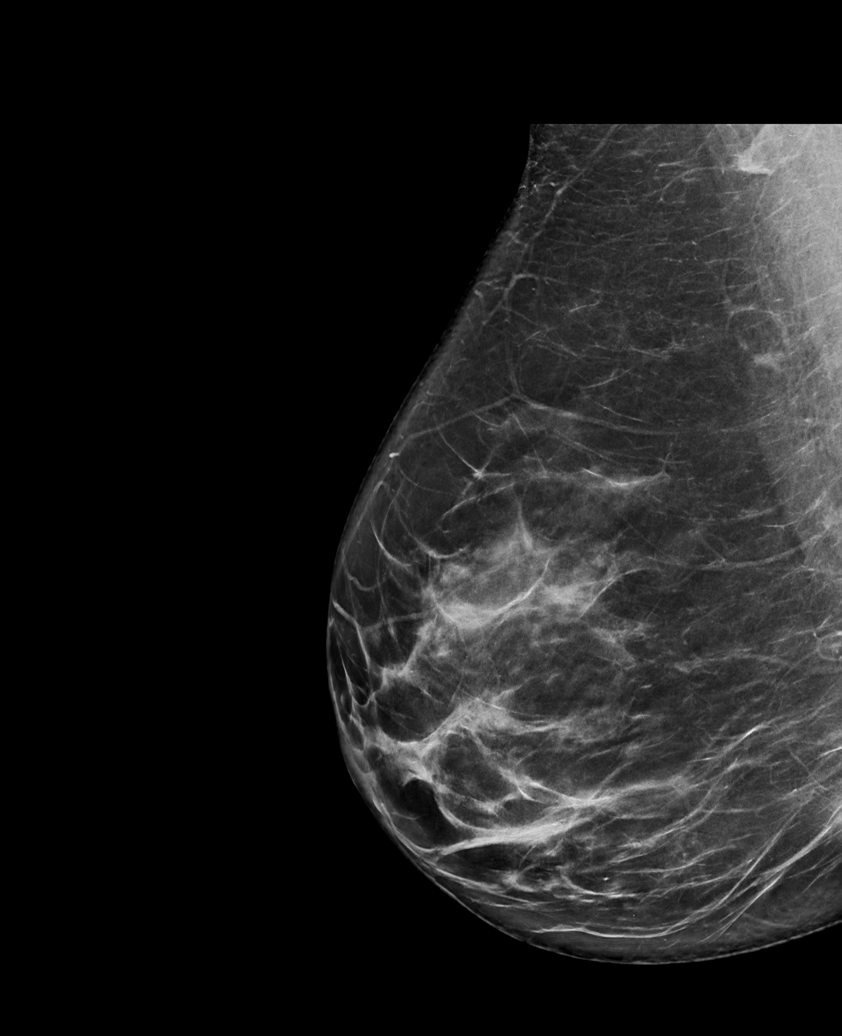

[L MLO synth-2D]
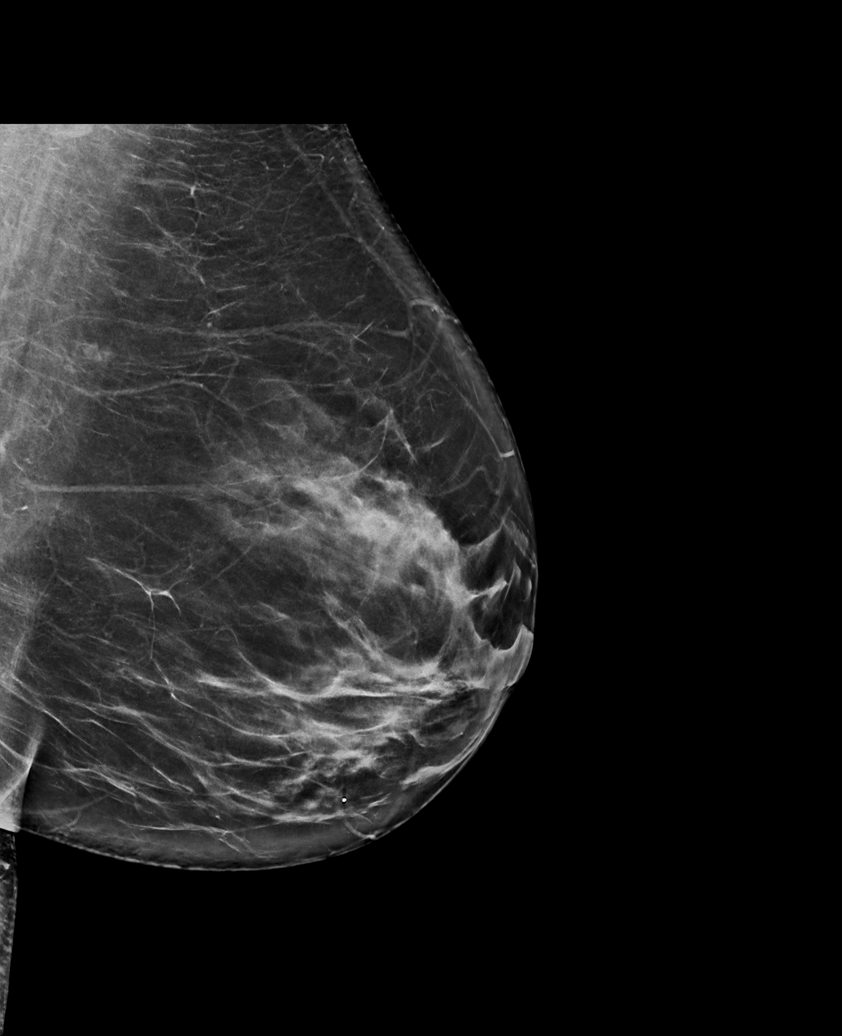

[R CC synth-2D (2 of 2)]
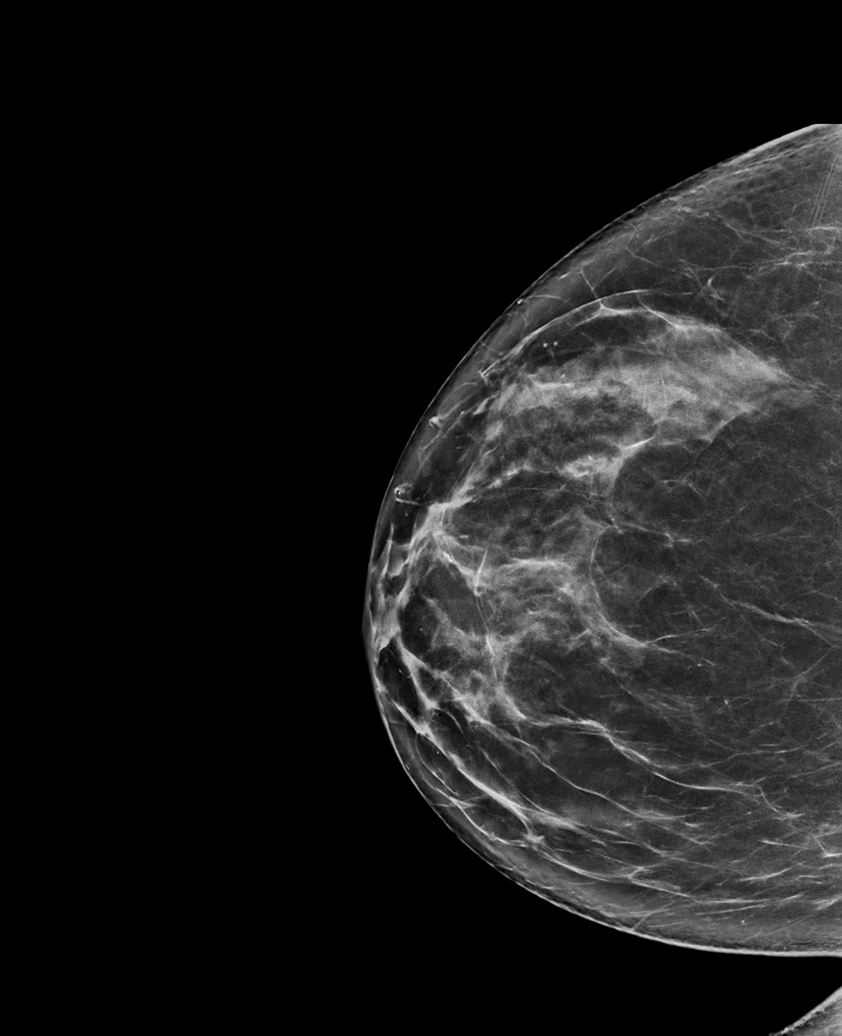

[L CC synth-2D]
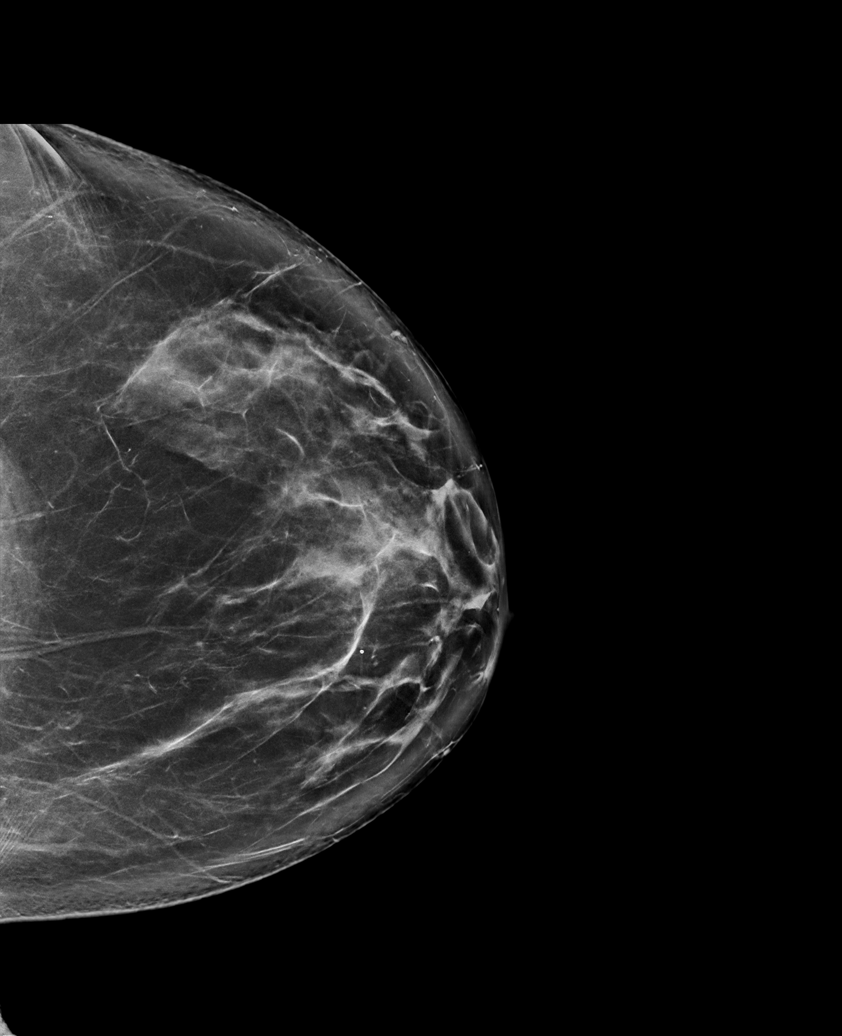

[R MLO tomo · tomo slice 43/85.0]
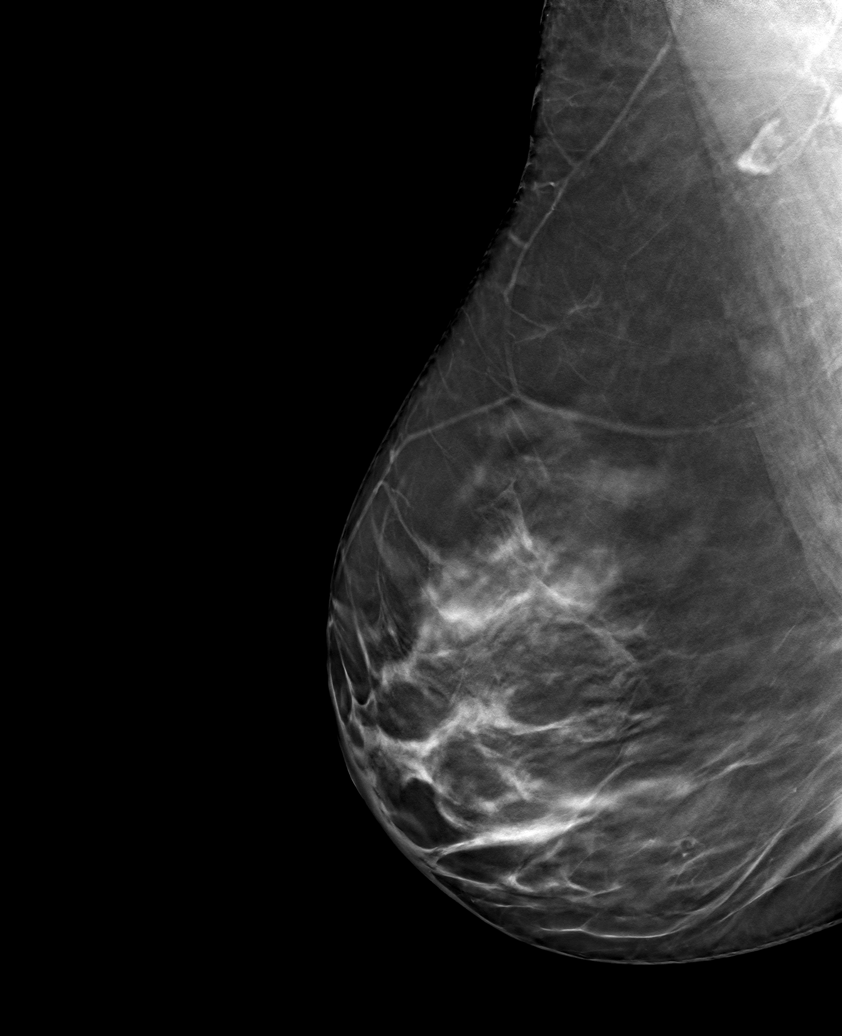

[6 of 30 positions shown; findings below may reference images not displayed]

ACR Breast Density Category c: The breast tissue is heterogeneously
dense, which may obscure small masses.
FINDINGS: There are no findings suspicious for malignancy. The images were
evaluated with computer-aided detection.
IMPRESSION: No mammographic evidence of malignancy. A result letter of this
screening mammogram will be mailed directly to the patient.

RECOMMENDATION:
Screening mammogram in one year. (Code:T4-5-GWO)

BI-RADS CATEGORY  1: Negative.

## 2022-02-16 ENCOUNTER — Encounter: Payer: Self-pay | Admitting: Gastroenterology

## 2022-05-12 ENCOUNTER — Other Ambulatory Visit: Payer: Self-pay | Admitting: Obstetrics and Gynecology

## 2022-05-12 DIAGNOSIS — Z1231 Encounter for screening mammogram for malignant neoplasm of breast: Secondary | ICD-10-CM

## 2022-05-23 DIAGNOSIS — I10 Essential (primary) hypertension: Secondary | ICD-10-CM | POA: Insufficient documentation

## 2022-06-06 ENCOUNTER — Encounter: Payer: Self-pay | Admitting: Gastroenterology

## 2022-06-06 ENCOUNTER — Telehealth: Payer: Self-pay

## 2022-06-06 ENCOUNTER — Ambulatory Visit (AMBULATORY_SURGERY_CENTER): Payer: 59

## 2022-06-06 VITALS — Ht 63.0 in | Wt 130.0 lb

## 2022-06-06 DIAGNOSIS — Z83719 Family history of colon polyps, unspecified: Secondary | ICD-10-CM

## 2022-06-06 DIAGNOSIS — Z86010 Personal history of colonic polyps: Secondary | ICD-10-CM

## 2022-06-06 NOTE — Progress Notes (Signed)

## 2022-06-06 NOTE — Telephone Encounter (Signed)
Noted  

## 2022-06-06 NOTE — Telephone Encounter (Signed)
Miralax, Gatorade, Ducolax prep is fine with they decline SUPREP

## 2022-07-07 ENCOUNTER — Encounter: Payer: 59 | Admitting: Gastroenterology

## 2022-07-13 ENCOUNTER — Encounter: Payer: Self-pay | Admitting: Gastroenterology

## 2022-07-13 ENCOUNTER — Ambulatory Visit (AMBULATORY_SURGERY_CENTER): Payer: 59 | Admitting: Gastroenterology

## 2022-07-13 VITALS — BP 134/88 | HR 75 | Resp 10 | Ht 63.0 in | Wt 130.0 lb

## 2022-07-13 DIAGNOSIS — K635 Polyp of colon: Secondary | ICD-10-CM

## 2022-07-13 DIAGNOSIS — D123 Benign neoplasm of transverse colon: Secondary | ICD-10-CM

## 2022-07-13 DIAGNOSIS — Z83719 Family history of colon polyps, unspecified: Secondary | ICD-10-CM | POA: Diagnosis not present

## 2022-07-13 DIAGNOSIS — Z1211 Encounter for screening for malignant neoplasm of colon: Secondary | ICD-10-CM

## 2022-07-13 MED ORDER — SODIUM CHLORIDE 0.9 % IV SOLN
500.0000 mL | Freq: Once | INTRAVENOUS | Status: DC
Start: 2022-07-13 — End: 2022-07-13

## 2022-07-13 NOTE — Progress Notes (Signed)
History & Physical  Primary Care Physician:  Jillian Palmer, MD Primary Gastroenterologist: Jillian Head, MD  Impression / Plan:  Family history of colon polyps in both parents for colonoscopy.  CHIEF COMPLAINT:  Family history of colon polyps   HPI: Jillian Patton is a 57 y.o. female with a family history of colon polyps in both parents for colonoscopy.   Past Medical History:  Diagnosis Date   Allergy    Anxiety    GERD (gastroesophageal reflux disease)    History of diverticulitis    Hypertension     Past Surgical History:  Procedure Laterality Date   APPENDECTOMY     CHOLECYSTECTOMY     COLONOSCOPY      Prior to Admission medications   Medication Sig Start Date End Date Taking? Authorizing Provider  cetirizine (ZYRTEC) 10 MG tablet Take 10 mg by mouth daily.   Yes [provider]  cholecalciferol (VITAMIN D) 1000 UNITS tablet Take 1,000 Units by mouth daily.   Yes [provider]  escitalopram (LEXAPRO) 20 MG tablet Take 1 tablet by mouth daily.   Yes [provider]  olmesartan-hydrochlorothiazide (BENICAR HCT) 40-25 MG tablet Take 1 tablet by mouth daily. 11/25/20  Yes [provider]  ALPRAZolam Prudy Feeler) 0.5 MG tablet Take 0.5 mg by mouth at bedtime as needed for anxiety.    [provider]  amoxicillin-clavulanate (AUGMENTIN) 875-125 MG tablet Take 1 tablet by mouth 2 (two) times daily. Patient Jillian taking: Reported on 06/06/2022 07/30/21   Jillian Pel, NP  Calcium Citrate (CITRACAL PO) Take 1 tablet by mouth daily.     [provider]  METRONIDAZOLE, TOPICAL, 0.75 % LOTN Apply and rub a thin film twice daily to affected areas after washing Patient Jillian taking: Reported on 06/06/2022 04/03/20   McVey, Madelaine Bhat, PA-C  montelukast (SINGULAIR) 10 MG tablet Take 10 mg by mouth at bedtime.    [provider]  MOUNJARO 2.5 MG/0.5ML Pen Inject 2.5 mg into the skin once a week. 01/19/21   [provider]  Multiple Vitamin (MULTI-DAY PO) Take by mouth.    [provider]  tirzepatide Greggory Keen) 10 MG/0.5ML Pen Inject 10 mg into the skin once a week. 06/28/21       Current Outpatient Medications  Medication Sig Dispense Refill   cetirizine (ZYRTEC) 10 MG tablet Take 10 mg by mouth daily.     cholecalciferol (VITAMIN D) 1000 UNITS tablet Take 1,000 Units by mouth daily.     escitalopram (LEXAPRO) 20 MG tablet Take 1 tablet by mouth daily.     olmesartan-hydrochlorothiazide (BENICAR HCT) 40-25 MG tablet Take 1 tablet by mouth daily.     ALPRAZolam (XANAX) 0.5 MG tablet Take 0.5 mg by mouth at bedtime as needed for anxiety.     amoxicillin-clavulanate (AUGMENTIN) 875-125 MG tablet Take 1 tablet by mouth 2 (two) times daily. (Patient Jillian taking: Reported on 06/06/2022) 14 tablet 0   Calcium Citrate (CITRACAL PO) Take 1 tablet by mouth daily.      METRONIDAZOLE, TOPICAL, 0.75 % LOTN Apply and rub a thin film twice daily to affected areas after washing (Patient Jillian taking: Reported on 06/06/2022) 59 mL 0   montelukast (SINGULAIR) 10 MG tablet Take 10 mg by mouth at bedtime.     MOUNJARO 2.5 MG/0.5ML Pen Inject 2.5 mg into the skin once a week.     Multiple Vitamin (MULTI-DAY PO) Take by mouth.     tirzepatide (MOUNJARO) 10 MG/0.5ML  Pen Inject 10 mg into the skin once a week. 2 mL 4   Current Facility-Administered Medications  Medication Dose Route Frequency Provider Last Rate Last Admin   0.9 %  sodium chloride infusion  500 mL Intravenous Once Jillian Dare, MD        Allergies as of 07/13/2022 - Review Complete 07/13/2022  Allergen Reaction Noted   Latex Rash 05/11/2015   Sulfa antibiotics  01/19/2021    Family History  Problem Relation Age of Onset   Hypertension Mother    Colon polyps Mother    Breast cancer Mother        53's   Hypertension Father    Colon polyps Father    Colon cancer Neg Hx    Esophageal cancer Neg Hx    Pancreatic cancer Neg Hx     Rectal cancer Neg Hx    Stomach cancer Neg Hx     Social History   Socioeconomic History   Marital status: Married    Spouse name: Jillian Patton   Number of children: Jillian Patton   Years of education: Jillian Patton   Highest education level: Jillian Patton  Occupational History   Jillian Patton  Tobacco Use   Smoking status: Never   Smokeless tobacco: Never  Substance and Sexual Activity   Alcohol use: Yes    Alcohol/week: 4.0 standard drinks of alcohol    Types: 4 Standard drinks or equivalent per week   Drug use: No   Sexual activity: Jillian Patton  Other Topics Concern   Jillian Patton  Social History Narrative   Jillian Patton   Social Determinants of Health   Financial Resource Strain: Jillian Patton  Food Insecurity: Jillian Patton  Transportation Needs: Jillian Patton  Physical Activity: Jillian Patton  Stress: Jillian Patton  Social Connections: Jillian Patton  Intimate Partner Violence: Jillian Patton    Review of Systems:  All systems reviewed were negative except where noted in HPI.   Physical Exam: General:  Alert, well-developed, in NAD Patton:  Normocephalic and atraumatic. Eyes:  Sclera clear, no icterus.   Conjunctiva pink. Ears:  Normal auditory acuity. Mouth:  No deformity or lesions.  Neck:  Supple; no masses. Lungs:  Clear throughout to auscultation.   No wheezes, crackles, or rhonchi.  Heart:  Regular rate and rhythm; no murmurs. Abdomen:  Soft, nondistended, nontender. No masses, hepatomegaly. No palpable masses.  Normal bowel sounds.    Rectal:  Deferred   Msk:  Symmetrical without gross deformities. Extremities:  Without edema. Neurologic:  Alert and  oriented x 4; grossly normal neurologically. Skin:  Intact without significant lesions or rashes. Psych:  Alert and cooperative. Normal mood and affect.  Jillian Patton. Jillian Patton  07/13/2022, 8:54 AM See Jillian Patton, [facility], to contact our on call provider

## 2022-07-13 NOTE — Patient Instructions (Addendum)
YOU HAD AN ENDOSCOPIC PROCEDURE TODAY AT THE  ENDOSCOPY CENTER:   Refer to the procedure report that was given to you for any specific questions about what was found during the examination.  If the procedure report does not answer your questions, please call your gastroenterologist to clarify.  If you requested that your care partner not be given the details of your procedure findings, then the procedure report has been included in a sealed envelope for you to review at your convenience later.  YOU SHOULD EXPECT: Some feelings of bloating in the abdomen. Passage of more gas than usual.  Walking can help get rid of the air that was put into your GI tract during the procedure and reduce the bloating. If you had a lower endoscopy (such as a colonoscopy or flexible sigmoidoscopy) you may notice spotting of blood in your stool or on the toilet paper. If you underwent a bowel prep for your procedure, you may not have a normal bowel movement for a few days.  Please Note:  You might notice some irritation and congestion in your nose or some drainage.  This is from the oxygen used during your procedure.  There is no need for concern and it should clear up in a day or so.  SYMPTOMS TO REPORT IMMEDIATELY:  Following lower endoscopy (colonoscopy or flexible sigmoidoscopy):  Excessive amounts of blood in the stool  Significant tenderness or worsening of abdominal pains  Swelling of the abdomen that is new, acute  Fever of 100F or higher  For urgent or emergent issues, a gastroenterologist can be reached at any hour by calling [phone]. Do not use MyChart messaging for urgent concerns.    DIET:  We do recommend a small meal at first, but then you may proceed to your regular diet.  Drink plenty of fluids but you should avoid alcoholic beverages for 24 hours.  ACTIVITY:  You should plan to take it easy for the rest of today and you should NOT DRIVE or use heavy machinery until tomorrow (because of  the sedation medicines used during the test).    FOLLOW UP: Our staff will call the number listed on your records the next business day following your procedure.  We will call around 7:15- 8:00 am to check on you and address any questions or concerns that you may have regarding the information given to you following your procedure. If we do not reach you, we will leave a message.     If any biopsies were taken you will be contacted by phone or by letter within the next 1-3 weeks.  Please call us at [phone] if you have not heard about the biopsies in 3 weeks.    SIGNATURES/CONFIDENTIALITY: You and/or your care partner have signed paperwork which will be entered into your electronic medical record.  These signatures attest to the fact that that the information above on your After Visit Summary has been reviewed and is understood.  Full responsibility of the confidentiality of this discharge information lies with you and/or your care-partner.  

## 2022-07-13 NOTE — Progress Notes (Signed)
Vss nad trans to pacu 

## 2022-07-13 NOTE — Op Note (Signed)
[facility] Patient Name: Jillian Patton Procedure Date: 07/13/2022 8:55 AM MRN: [id number] Endoscopist: Meryl Dare , MD, [phone] Age: 57 Referring MD:  Date of Birth: Nov 28, 1965 Gender: Female Account #: [id number] Procedure:                Colonoscopy Indications:              Colon cancer screening in patient at increased                            risk: Family history of colon polyps in multiple                            1st-degree relatives Medicines:                Monitored Anesthesia Care Procedure:                Pre-Anesthesia Assessment:                           - Prior to the procedure, a History and Physical                            was performed, and patient medications and                            allergies were reviewed. The patient's tolerance of                            previous anesthesia was also reviewed. The risks                            and benefits of the procedure and the sedation                            options and risks were discussed with the patient.                            All questions were answered, and informed consent                            was obtained. Prior Anticoagulants: The patient has                            taken no anticoagulant or antiplatelet agents. ASA                            Grade Assessment: II - A patient with mild systemic                            disease. After reviewing the risks and benefits,                            the patient was deemed in satisfactory condition to  undergo the procedure.                           After obtaining informed consent, the colonoscope                            was passed under direct vision. Throughout the                            procedure, the patient's blood pressure, pulse, and                            oxygen saturations were monitored continuously. The                            CF [id number] #[id number] was introduced  through the anus                            and advanced to the the cecum, identified by                            appendiceal orifice and ileocecal valve. The                            ileocecal valve, appendiceal orifice, and rectum                            were photographed. The quality of the bowel                            preparation was good. The colonoscopy was performed                            without difficulty. The patient tolerated the                            procedure well. Scope In: 9:04:38 AM Scope Out: 9:19:34 AM Scope Withdrawal Time: 0 hours 11 minutes 34 seconds  Total Procedure Duration: 0 hours 14 minutes 56 seconds  Findings:                 The perianal and digital rectal examinations were                            normal.                           Two sessile polyps were found in the distal                            transverse colon and hepatic flexure. The polyps                            were 5 to 7 mm in size. These polyps were removed  with a cold snare. Resection and retrieval were                            complete.                           A few small-mouthed diverticula were found in the                            left colon. There was no evidence of diverticular                            bleeding.                           Internal hemorrhoids were found during                            retroflexion. The hemorrhoids were small and Grade                            I (internal hemorrhoids that do not prolapse).                           The exam was otherwise without abnormality on                            direct and retroflexion views. Complications:            No immediate complications. Estimated blood loss:                            None. Estimated Blood Loss:     Estimated blood loss: none. Impression:               - Two 5 to 7 mm polyps in the distal transverse                            colon and at  the hepatic flexure, removed with a                            cold snare. Resected and retrieved.                           - Mild diverticulosis in the left colon.                           - Internal hemorrhoids.                           - The examination was otherwise normal on direct                            and retroflexion views. Recommendation:           - Repeat colonoscopy, likely 5 years, after studies  are complete for surveillance based on pathology                            results.                           - Patient has a contact number available for                            emergencies. The signs and symptoms of potential                            delayed complications were discussed with the                            patient. Return to normal activities tomorrow.                            Written discharge instructions were provided to the                            patient.                           - High fiber diet.                           - Continue present medications.                           - Await pathology results. Meryl Dare, MD 07/13/2022 9:22:52 AM This report has been signed electronically.

## 2022-07-13 NOTE — Progress Notes (Signed)
Pt's states no medical or surgical changes since previsit or office visit. 

## 2022-07-13 NOTE — Progress Notes (Signed)
Called to room to assist during endoscopic procedure.  Patient ID and intended procedure confirmed with present staff. Received instructions for my participation in the procedure from the performing physician.  

## 2022-07-14 ENCOUNTER — Telehealth: Payer: Self-pay | Admitting: *Deleted

## 2022-07-14 NOTE — Telephone Encounter (Signed)
Attempted to call patient for their post-procedure follow-up call. No answer. Left voicemail.   

## 2022-07-22 ENCOUNTER — Encounter: Payer: Self-pay | Admitting: Gastroenterology

## 2022-08-04 ENCOUNTER — Ambulatory Visit: Payer: 59

## 2022-10-06 ENCOUNTER — Ambulatory Visit: Payer: 59

## 2022-10-25 ENCOUNTER — Ambulatory Visit: Payer: 59

## 2022-10-27 ENCOUNTER — Ambulatory Visit
Admission: RE | Admit: 2022-10-27 | Discharge: 2022-10-27 | Disposition: A | Discharge status: Home / Self Care | Payer: 59 | Source: Ambulatory Visit | Attending: Obstetrics and Gynecology | Admitting: Obstetrics and Gynecology

## 2022-10-27 DIAGNOSIS — Z1231 Encounter for screening mammogram for malignant neoplasm of breast: Secondary | ICD-10-CM

## 2023-03-14 ENCOUNTER — Other Ambulatory Visit (HOSPITAL_COMMUNITY): Payer: Self-pay

## 2023-10-04 ENCOUNTER — Other Ambulatory Visit: Payer: Self-pay | Admitting: Obstetrics and Gynecology

## 2023-10-04 DIAGNOSIS — Z1231 Encounter for screening mammogram for malignant neoplasm of breast: Secondary | ICD-10-CM

## 2023-10-23 ENCOUNTER — Ambulatory Visit

## 2023-10-23 ENCOUNTER — Telehealth: Admitting: Family Medicine

## 2023-10-23 DIAGNOSIS — H1031 Unspecified acute conjunctivitis, right eye: Secondary | ICD-10-CM

## 2023-10-23 MED ORDER — POLYMYXIN B-TRIMETHOPRIM 10000-0.1 UNIT/ML-% OP SOLN: 1.0000 [drp] | OPHTHALMIC | 0 refills | Status: AC

## 2023-10-23 NOTE — Progress Notes (Signed)
 Virtual Visit Consent   Cyntha Brickman Feldpausch, you are scheduled for a virtual visit with a [facility] provider today. Just as with appointments in the office, your consent must be obtained to participate. Your consent will be active for this visit and any virtual visit you may have with one of our providers in the next 365 days. If you have a MyChart account, a copy of this consent can be sent to you electronically.  As this is a virtual visit, video technology does not allow for your provider to perform a traditional examination. This may limit your provider's ability to fully assess your condition. If your provider identifies any concerns that need to be evaluated in person or the need to arrange testing (such as labs, EKG, etc.), we will make arrangements to do so. Although advances in technology are sophisticated, we cannot ensure that it will always work on either your end or our end. If the connection with a video visit is poor, the visit may have to be switched to a telephone visit. With either a video or telephone visit, we are not always able to ensure that we have a secure connection.  By engaging in this virtual visit, you consent to the provision of healthcare and authorize for your insurance to be billed (if applicable) for the services provided during this visit. Depending on your insurance coverage, you may receive a charge related to this service.  I need to obtain your verbal consent now. Are you willing to proceed with your visit today? Jillian Patton has provided verbal consent on 10/23/2023 for a virtual visit (video or telephone). Loa Lamp, FNP  Date: 10/23/2023 12:05 PM   Virtual Visit via Video Note   I, Loa Lamp, connected with  Jillian Patton  ([id number], Feb 15, 1966) on 10/23/23 at 12:00 PM EDT by a video-enabled telemedicine application and verified that I am speaking with the correct person using two identifiers.  Location: Patient: Virtual Visit Location  Patient: Home Provider: Virtual Visit Location Provider: Home Office   I discussed the limitations of evaluation and management by telemedicine and the availability of in person appointments. The patient expressed understanding and agreed to proceed.    History of Present Illness: Jillian Patton is a 58 y.o. who identifies as a female who was assigned female at birth, and is being seen today for redness, drainage and irritation to the rt eye for 1 day. Jillian Patton  HPI: HPI  Problems:  Patient Active Problem List   Diagnosis Date Noted   Hypertensive disorder 05/23/2022   Prediabetes 01/26/2021   Seasonal allergies 06/16/2020   Carpal tunnel syndrome, bilateral 05/11/2015   DIARRHEA 10/23/2007    Allergies:  Allergies  Allergen Reactions   Latex Rash   Sulfa Antibiotics     Other reaction(s): rash   Medications:  Current Outpatient Medications:    trimethoprim -polymyxin b  (POLYTRIM ) ophthalmic solution, Place 1 drop into the right eye every 4 (four) hours for 7 days., Disp: 10 mL, Rfl: 0   ALPRAZolam (XANAX) 0.5 MG tablet, Take 0.5 mg by mouth at bedtime as needed for anxiety., Disp: , Rfl:    amoxicillin -clavulanate (AUGMENTIN ) 875-125 MG tablet, Take 1 tablet by mouth 2 (two) times daily. (Patient not taking: Reported on 06/06/2022), Disp: 14 tablet, Rfl: 0   Calcium Citrate (CITRACAL PO), Take 1 tablet by mouth daily. , Disp: , Rfl:    cetirizine (ZYRTEC) 10 MG tablet, Take 10 mg by mouth daily., Disp: , Rfl:  cholecalciferol (VITAMIN D) 1000 UNITS tablet, Take 1,000 Units by mouth daily., Disp: , Rfl:    escitalopram (LEXAPRO) 20 MG tablet, Take 1 tablet by mouth daily., Disp: , Rfl:    METRONIDAZOLE , TOPICAL, 0.75 % LOTN, Apply and rub a thin film twice daily to affected areas after washing (Patient not taking: Reported on 06/06/2022), Disp: 59 mL, Rfl: 0   montelukast (SINGULAIR) 10 MG tablet, Take 10 mg by mouth at bedtime., Disp: , Rfl:    MOUNJARO  2.5 MG/0.5ML Pen, Inject 2.5  mg into the skin once a week., Disp: , Rfl:    Multiple Vitamin (MULTI-DAY PO), Take by mouth., Disp: , Rfl:    olmesartan-hydrochlorothiazide (BENICAR HCT) 40-25 MG tablet, Take 1 tablet by mouth daily., Disp: , Rfl:    tirzepatide  (MOUNJARO ) 10 MG/0.5ML Pen, Inject 10 mg into the skin once a week., Disp: 2 mL, Rfl: 4  Observations/Objective: Patient is well-developed, well-nourished in no acute distress.  Resting comfortably  at home.  Head is normocephalic, atraumatic.  No labored breathing.  Speech is clear and coherent with logical content.  Patient is alert and oriented at baseline.    Assessment and Plan: 1. Acute bacterial conjunctivitis of right eye (Primary)  Ways to prevent transmission discussed, UC as needed.   Follow Up Instructions: I discussed the assessment and treatment plan with the patient. The patient was provided an opportunity to ask questions and all were answered. The patient agreed with the plan and demonstrated an understanding of the instructions.  A copy of instructions were sent to the patient via MyChart unless otherwise noted below.     The patient was advised to call back or seek an in-person evaluation if the symptoms worsen or if the condition fails to improve as anticipated.    Dessire Grimes, FNP

## 2023-10-23 NOTE — Patient Instructions (Signed)
Allergic Conjunctivitis, Adult Allergic conjunctivitis is inflammation of the conjunctiva. The conjunctiva is the thin, clear membrane that covers the white part of the eye and the inner surface of the eyelid. Allergies can affect this layer of the eye. In this condition: The blood vessels in the conjunctiva swell and become irritated. The eyes become red or pink and feel itchy. There is often a watery discharge from the eyes. Allergic conjunctivitis is not contagious. This means it cannot be spread from person to person. The condition can develop at any age and may be outgrown. What are the causes? This condition is caused by allergens. These are things that can cause an allergic reaction in some people. Common allergens include: Outdoor allergens, such as: Pollen, including pollen from grass and weeds. Mold spores. Car fumes. Pollution. Indoor allergens, such as: Dust. Smoke. Mold spores. Proteins in a pet's urine, saliva, or dander. Protein buildup on contact lenses. What increases the risk? You may be more likely to develop this condition if you have a family history of these things: Allergies. Conditions caused by being exposed to allergens, such as: Allergic rhinitis. This is an allergic reaction that affects the nose. Bronchial asthma. This condition affects the large airways in the lungs and makes breathing difficult. Atopic dermatitis (eczema). This is inflammation of the skin that is long-term (chronic). What are the signs or symptoms? Symptoms of this condition include eyes that are itchy, red, watery, or puffy. Your eyes may also: Sting or burn. Have clear fluid draining from them. Have thick mucus discharge and pain (vernal conjunctivitis). This happens in severe cases. How is this diagnosed? This condition may be diagnosed based on: Your medical history. A physical exam, including an eye exam. Tests of the fluid draining from your eyes to rule out other causes. Other  tests to confirm the diagnosis, including: Testing for allergies. The skin may be pricked with a tiny needle. The pricked area is then exposed to small amounts of allergens. Testing for other eye conditions. Tests may include: Blood tests. Tissue scrapings from your eyelid. The tissue is then checked under a microscope. How is this treated? Treatment for this condition may include: Using cold, wet cloths (cold compresses) to soothe itching and swelling. Washing your face and hair. Also, washing your clothes often to remove allergens. Using eye drops. These may be prescription or over-the-counter. You may need to try different types to see which one works best for you. Examples include: Eye drops that wash allergens out of the eyes (preservative-free artificial tears). Eye drops that block the allergic reaction (antihistamine). Eye drops that reduce swelling and irritation (anti-inflammatory). Steroid eye drops, which may be given if other treatments have not worked. Oral antihistamine medicines. These are medicines taken by mouth to lessen your allergic reaction. You may need these if eye drops do not help or are difficult to use. An air purifier at home and work. Wraparound sunglasses. This may help to decrease the amount of allergens reaching the eye. Not wearing contact lenses until symptoms improve, if the condition was caused by contact lenses. Change to daily wear disposable contact lenses, if possible. Follow these instructions at home: Eye care Apply a clean, cold compress to your eyes for 10-20 minutes, 3-4 times a day. Do not touch or rub your eyes. Do not wear contact lenses until the inflammation is gone. Wear glasses instead. Do not wear eye makeup until the inflammation is gone. General instructions Avoid known allergens whenever possible. Take or apply  over-the-counter and prescription medicines only as told by your health care provider. These include any eye drops. Drink  enough fluid to keep your urine pale yellow. Keep all follow-up visits. Contact a health care provider if: Your symptoms get worse or do not get better with treatment. You have mild eye pain. You become sensitive to light. You have spots or blisters on your eyes. You have a fever. Get help right away if: You have redness, swelling, or other symptoms in only one eye. Your vision is blurred or you have other vision changes. You have pus draining from your eyes. You have severe eye pain. Summary Allergic conjunctivitis is inflammation of the eye that is caused by allergens. It affects the clear membrane that covers the white part of the eye and the inner surface of the eyelid. It often causes eye itching, redness, and a watery discharge. Take or apply over-the-counter and prescription medicines only as told by your health care provider. These include eye drops. Do not touch or rub your eyes. Contact a health care provider if your symptoms get worse or do not get better with treatment. This information is not intended to replace advice given to you by your health care provider. Make sure you discuss any questions you have with your health care provider. Document Revised: 05/24/2021 Document Reviewed: 05/24/2021 Elsevier Patient Education  2024 [employer].

## 2023-10-31 ENCOUNTER — Ambulatory Visit
Admission: RE | Admit: 2023-10-31 | Discharge: 2023-10-31 | Disposition: A | Discharge status: Home / Self Care | Source: Ambulatory Visit | Attending: Obstetrics and Gynecology | Admitting: Obstetrics and Gynecology

## 2023-10-31 DIAGNOSIS — Z1231 Encounter for screening mammogram for malignant neoplasm of breast: Secondary | ICD-10-CM

## 2023-11-02 ENCOUNTER — Other Ambulatory Visit: Payer: Self-pay | Admitting: Obstetrics and Gynecology

## 2023-11-02 DIAGNOSIS — R928 Other abnormal and inconclusive findings on diagnostic imaging of breast: Secondary | ICD-10-CM

## 2023-11-03 ENCOUNTER — Ambulatory Visit
Admission: RE | Admit: 2023-11-03 | Discharge: 2023-11-03 | Disposition: A | Discharge status: Home / Self Care | Source: Ambulatory Visit | Attending: Obstetrics and Gynecology | Admitting: Obstetrics and Gynecology

## 2023-11-03 DIAGNOSIS — R928 Other abnormal and inconclusive findings on diagnostic imaging of breast: Secondary | ICD-10-CM

## 2023-11-21 ENCOUNTER — Ambulatory Visit

## 2023-11-30 ENCOUNTER — Telehealth: Payer: Self-pay | Admitting: Gastroenterology

## 2023-11-30 NOTE — Telephone Encounter (Signed)
 Inbound call from pt requesting to be prescribed medication for her diverticulitis flare up she is having. Patient is a pt of Dr. Aneita. She was scheduled with Camie Furbish for a 1:30 appointment. Please advise.

## 2023-11-30 NOTE — Telephone Encounter (Signed)
 The pt was called and states that she has an appt for tomorrow with Camie and will keep that to discuss treatment if needed.

## 2023-12-01 ENCOUNTER — Ambulatory Visit: Admitting: Gastroenterology

## 2023-12-01 ENCOUNTER — Ambulatory Visit: Payer: Self-pay | Admitting: Gastroenterology

## 2023-12-01 ENCOUNTER — Encounter: Payer: Self-pay | Admitting: Gastroenterology

## 2023-12-01 ENCOUNTER — Other Ambulatory Visit

## 2023-12-01 ENCOUNTER — Other Ambulatory Visit: Payer: Self-pay | Admitting: Obstetrics and Gynecology

## 2023-12-01 ENCOUNTER — Ambulatory Visit (HOSPITAL_BASED_OUTPATIENT_CLINIC_OR_DEPARTMENT_OTHER)
Admission: RE | Admit: 2023-12-01 | Discharge: 2023-12-01 | Disposition: A | Discharge status: Home / Self Care | Source: Ambulatory Visit | Attending: Gastroenterology | Admitting: Gastroenterology

## 2023-12-01 VITALS — BP 142/90 | HR 84 | Ht 63.0 in | Wt 139.0 lb

## 2023-12-01 DIAGNOSIS — Z860101 Personal history of adenomatous and serrated colon polyps: Secondary | ICD-10-CM

## 2023-12-01 DIAGNOSIS — Z8719 Personal history of other diseases of the digestive system: Secondary | ICD-10-CM | POA: Insufficient documentation

## 2023-12-01 DIAGNOSIS — Z803 Family history of malignant neoplasm of breast: Secondary | ICD-10-CM

## 2023-12-01 DIAGNOSIS — R1032 Left lower quadrant pain: Secondary | ICD-10-CM

## 2023-12-01 DIAGNOSIS — Z83719 Family history of colon polyps, unspecified: Secondary | ICD-10-CM | POA: Diagnosis not present

## 2023-12-01 LAB — CBC WITH DIFFERENTIAL/PLATELET
Basophils Absolute: 0 K/uL (ref 0.0–0.1)
Basophils Relative: 0.5 % (ref 0.0–3.0)
Eosinophils Absolute: 0.1 K/uL (ref 0.0–0.7)
Eosinophils Relative: 0.9 % (ref 0.0–5.0)
HCT: 37.8 % (ref 36.0–46.0)
Hemoglobin: 13.1 g/dL (ref 12.0–15.0)
Lymphocytes Relative: 27.1 % (ref 12.0–46.0)
Lymphs Abs: 1.9 K/uL (ref 0.7–4.0)
MCHC: 34.7 g/dL (ref 30.0–36.0)
MCV: 91.2 fl (ref 78.0–100.0)
Monocytes Absolute: 0.4 K/uL (ref 0.1–1.0)
Monocytes Relative: 6.3 % (ref 3.0–12.0)
Neutro Abs: 4.6 K/uL (ref 1.4–7.7)
Neutrophils Relative %: 65.2 % (ref 43.0–77.0)
Platelets: 231 K/uL (ref 150.0–400.0)
RBC: 4.14 Mil/uL (ref 3.87–5.11)
RDW: 12.4 % (ref 11.5–15.5)
WBC: 7.1 K/uL (ref 4.0–10.5)

## 2023-12-01 LAB — COMPREHENSIVE METABOLIC PANEL WITH GFR
ALT: 22 U/L (ref 0–35)
AST: 20 U/L (ref 0–37)
Albumin: 4.5 g/dL (ref 3.5–5.2)
Alkaline Phosphatase: 44 U/L (ref 39–117)
BUN: 24 mg/dL — ABNORMAL HIGH (ref 6–23)
CO2: 32 meq/L (ref 19–32)
Calcium: 9.1 mg/dL (ref 8.4–10.5)
Chloride: 100 meq/L (ref 96–112)
Creatinine, Ser: 0.67 mg/dL (ref 0.40–1.20)
GFR: 97.38 mL/min (ref >60.00)
Glucose, Bld: 104 mg/dL — ABNORMAL HIGH (ref 70–99)
Potassium: 3.9 meq/L (ref 3.5–5.1)
Sodium: 139 meq/L (ref 135–145)
Total Bilirubin: 1.3 mg/dL — ABNORMAL HIGH (ref 0.2–1.2)
Total Protein: 6.8 g/dL (ref 6.0–8.3)

## 2023-12-01 MED ORDER — IOHEXOL 300 MG/ML  SOLN
75.0000 mL | Freq: Once | INTRAMUSCULAR | Status: AC | PRN
  Administered 2023-12-01: 75 mL via INTRAVENOUS

## 2023-12-01 MED ORDER — AMOXICILLIN-POT CLAVULANATE 875-125 MG PO TABS: 1.0000 | ORAL_TABLET | Freq: Two times a day (BID) | ORAL | 0 refills | Status: DC

## 2023-12-01 NOTE — Progress Notes (Addendum)
 Jillian Patton [id number] [birth year]   Chief Complaint: Abdominal pain  Referring Provider: Verena Mems, MD Primary GI MD: Sampson (previous Dr. 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    Patient last seen in office 02/09/2021 by Jillian Dasen, NP for recent episode of uncomplicated sigmoid diverticulitis which was found on CT abdomen/pelvis in the ED.  Had total resolution of LLQ abdominal pain with course of Augmentin .  She was advised to start high-fiber diet.  Patient has family history of precancerous polyps in both parents in their early 71s.  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   Patient states she began having LLQ abdominal pain on Monday evening.  States pain level is currently 4/10.  Feels similar to when she had diverticulitis in the past.  Does not feel symptoms are worsening but have been persistent over the last 5 days.  Pain does not radiate, and is not severe but more tender than painful.  She denies any fever or chills.  Has still been eating a regular diet.  States that she ate some popcorn on Saturday and normally does not eat popcorn, wonders if this may have triggered this flare.  Has not had an episode of diverticulitis since initial episode in 2022.  At that time states that Augmentin  did help her symptoms.  Reports that she is having regular bowel movements and denies any diarrhea, constipation, blood in her stool, melena.  She reports a remote history of an ovarian abscess.  Previous GI Procedures/Imaging   Colonoscopy 07/13/2022 - Two 5 to 7 mm polyps in the distal transverse colon and at the hepatic flexure, removed with a cold snare. Resected and retrieved.  - Mild diverticulosis in the left colon.  - Internal hemorrhoids.   - The examination was otherwise normal on direct and retroflexion views. - Recall 5 years Path: Surgical [P], colon, hepatic flexure, transverse, polyp (2) TUBULAR ADENOMA, NEGATIVE FOR HIGH-GRADE DYSPLASIA. SESSILE SERRATED POLYP WITHOUT CYTOLOGIC DYSPLASIA.  CT A/P 01/16/2021 1. Acute uncomplicated sigmoid diverticulitis. 2. Bibasilar subsegmental atelectasis. 3. 3.9 cm mildly hypoenhancing mass in the left uterine fundus, favored represent uterine leiomyoma. Nonemergent pelvic ultrasound could be considered for further evaluation  Past Medical History:  Diagnosis Date   Allergy    Anxiety    GERD (gastroesophageal reflux disease)    History of diverticulitis    Hypertension     Past Surgical History:  Procedure Laterality Date   APPENDECTOMY     CHOLECYSTECTOMY     COLONOSCOPY      Current Outpatient Medications  Medication Sig Dispense Refill   ALPRAZolam (XANAX) 0.5 MG tablet Take 0.5 mg by mouth at bedtime as needed for anxiety.     Calcium Citrate (CITRACAL PO) Take 1 tablet by mouth daily.      cetirizine (ZYRTEC) 10 MG tablet Take 10 mg by mouth daily.     cholecalciferol (VITAMIN D) 1000 UNITS tablet Take 1,000 Units by mouth daily.     cyclobenzaprine (FLEXERIL) 5 MG tablet Take 5 mg by mouth every 8 (eight) hours as needed.     escitalopram (LEXAPRO) 20 MG tablet Take 1 tablet by mouth daily.     Multiple Vitamin (MULTI-DAY PO) Take by mouth.     olmesartan (BENICAR) 20 MG tablet Take  20 mg by mouth daily.     ZEPBOUND  10 MG/0.5ML Pen Inject 10 mg into the skin once a week.     No current facility-administered medications for this visit.    Allergies as of 12/01/2023 - Review Complete 12/01/2023  Allergen Reaction Noted   Latex Rash 05/11/2015   Sulfa antibiotics  01/19/2021    Family History  Problem Relation Age of Onset   Hypertension Mother    Colon polyps Mother    Breast cancer Mother        19's   Hypertension Father    Colon polyps  Father    Colon cancer Neg Hx    Esophageal cancer Neg Hx    Pancreatic cancer Neg Hx    Rectal cancer Neg Hx    Stomach cancer Neg Hx     Social History   Tobacco Use   Smoking status: Never   Smokeless tobacco: Never  Substance Use Topics   Alcohol use: Yes    Alcohol/week: 4.0 standard drinks of alcohol    Types: 4 Standard drinks or equivalent per week   Drug use: No     Review of Systems:    Constitutional: No weight loss, fever, chills Cardiovascular: No chest pain Respiratory: No SOB Gastrointestinal: See HPI and otherwise negative Hematologic: No bleeding     Physical Exam:  Vital signs: BP (!) 142/90 (BP Location: Left Arm, Patient Position: Sitting, Cuff Size: Normal)   Pulse 84   Ht 5' 3 (1.6 m) Comment: Height measured without shoes  Wt 139 lb (63 kg)   LMP 02/18/2012   BMI 24.62 kg/m   Constitutional: Pleasant female in NAD, alert and cooperative Head:  Normocephalic and atraumatic.  Eyes: No scleral icterus.  Respiratory: Respirations even and unlabored. Lungs clear to auscultation bilaterally.  No wheezes, crackles, or rhonchi.  Cardiovascular:  Regular rate and rhythm. No murmurs. No peripheral edema. Gastrointestinal:  Soft, nondistended, mildly tender to palpation of LLQ. No rebound or guarding. Normal bowel sounds. No appreciable masses or hepatomegaly. Rectal:  Not performed.  Neurologic:  Alert and oriented x4;  grossly normal neurologically.  Skin:   Dry and intact without significant lesions or rashes. Psychiatric: Oriented to person, place and time. Demonstrates good judgement and reason without abnormal affect or behaviors.   RELEVANT LABS AND IMAGING: CBC    Component Value Date/Time   WBC 9.5 01/16/2021 0956   RBC 4.21 01/16/2021 0956   HGB 13.1 01/16/2021 0956   HCT 37.3 01/16/2021 0956   PLT 242 01/16/2021 0956   MCV 88.6 01/16/2021 0956   MCH 31.1 01/16/2021 0956   MCHC 35.1 01/16/2021 0956   RDW 12.0 01/16/2021 0956    LYMPHSABS 2.3 01/13/2017 1004   MONOABS 0.5 01/13/2017 1004   EOSABS 0.1 01/13/2017 1004   BASOSABS 0.0 01/13/2017 1004    CMP     Component Value Date/Time   NA 138 01/16/2021 0956   K 3.4 (L) 01/16/2021 0956   CL 99 01/16/2021 0956   CO2 30 01/16/2021 0956   GLUCOSE 114 (H) 01/16/2021 0956   BUN 12 01/16/2021 0956   CREATININE 0.72 01/16/2021 0956   CALCIUM 9.6 01/16/2021 0956   PROT 7.0 01/16/2021 0956   ALBUMIN 4.5 01/16/2021 0956   AST 24 01/16/2021 0956   ALT 57 (H) 01/16/2021 0956   ALKPHOS 59 01/16/2021 0956   BILITOT 1.0 01/16/2021 0956   GFRNONAA >60 01/16/2021 0956     Assessment/Plan:   LLQ abdominal pain  History of diverticulitis Patient seen today for evaluation of LLQ abdominal pain with concern for diverticulitis.  Had an episode of diverticulitis 12/2020 at which time she was seen in the ED and had a CT scan which showed acute uncomplicated sigmoid diverticulitis.  She was treated at that time with Augmentin  with improvement in symptoms.  Currently LLQ abdominal pain is a 4/10 and she states it feels similar to when she had diverticulitis in the past.  She has not adjusted her diet, states she is having regular bowel movements.  Denies any nausea, vomiting, fever, chills.  Does have some tenderness to palpation.  Reports remote history of ovarian abscess. Last colonoscopy 06/2022 with finding of 1 TA and 1 SSP with recommended recall in 5 years. Will go ahead and start her on Augmentin , check labs today, and order CT for further evaluation.  - Labs today: CBC, CMP - Start Augmentin  875 mg twice daily for 10 days - Will order CT A/P for further evaluation - Recommend liquid diet for a couple days with gradual advancement once symptoms begin to improve - ED precautions given - Follow-up 4 weeks   Camie Furbish, PA-C [facility] 12/01/2023, 1:44 PM  Addendum: CT shows uncomplicated diverticulitis. Message sent to patient to continue regular diet.    Patient Care Team: Verena Mems, MD as PCP - General (Family Medicine)

## 2023-12-01 NOTE — Patient Instructions (Addendum)
 You have been scheduled for a CT scan of the abdomen and pelvis at [facility] -[address] [place], [place] [address] You are scheduled on 12/01/23 at 5:00 pm, but you need to arrive by 2:50 pm.   You may take any medications as prescribed with a small amount of water, if necessary. If you take any of the following medications: METFORMIN, GLUCOPHAGE, GLUCOVANCE, AVANDAMET, RIOMET, FORTAMET, ACTOPLUS MET, JANUMET, GLUMETZA or METAGLIP, you MAY be asked to HOLD this medication 48 hours AFTER the exam.   The purpose of you drinking the oral contrast is to aid in the visualization of your intestinal tract. The contrast solution may cause some diarrhea. Depending on your individual set of symptoms, you may also receive an intravenous injection of x-ray contrast/dye. Plan on being at [facility] for 45 minutes or longer, depending on the type of exam you are having performed.   If you have any questions regarding your exam or if you need to reschedule, you may call [facility] at [phone] between the hours of 8:00 am and 5:00 pm, Monday-Friday.     Your provider has requested that you go to the basement level for lab work before leaving today. Press B on the elevator. The lab is located at the first door on the left as you exit the elevator.  We have sent the following medications to your pharmacy for you to pick up at your convenience: Augmentin    Start Clear Liquid diet   Clear Liquid Diet, Adult A clear liquid diet is a diet that includes only liquids and semi-liquids that you can see through when you hold them up to a light. An example of a semi-liquid is gelatin. No solid food is eaten on this diet. You may need to follow a clear liquid diet if: You have a problem right before or after you have surgery. You did not eat food for a long time. You had: Vomiting, feeling like you may vomit, or nausea. Diarrhea, which is watery poop. You are going to have a test to  look at your digestive system. You are going to have bowel surgery. What are the goals of this diet? To help your stomach and digestive system rest. To help clear your digestive system before a test. To make sure that you get enough fluid. To make sure you get some calories for energy. To help you get back to eating like you used to. What are tips for following this plan? This diet does not give you all the nutrients that you need. Choose a variety of the liquids that your health care provider says you can have on this diet. That way, you will get as many nutrients as possible. If you are not sure whether you can have certain things, ask your provider. If you cannot swallow a thin liquid, you will need to thicken it before drinking it. This will stop you from breathing it in. What foods should I eat?  Water and flavored water. Fruit juices that do not have pulp in them. This includes cranberry juice, apple juice, and grape juice. Tea and coffee without milk or cream. Clear broth. Broth-based soups that have been strained to get all the food pieces out. Flavored gelatin. Honey. Sugar water. Ice or frozen ice pops that do not have any milk, yogurt, fruit pieces, or fruit pulp in them. Clear sodas. Clear sports drinks. The items listed above may not be all the foods and drinks you  can have. Talk with an expert in healthy eating called a dietitian to learn more. What food should I avoid? Juices that have pulp. Milk. Cream or cream-based soups. Yogurt. Solid foods that are not clear liquids or semi-liquids. The items listed above may not be all the foods and drinks you should avoid. Talk with a dietitian to learn more. Questions to ask your health care provider: How long do I need to follow this diet? Are there any medicines that I should change while on this diet? This information is not intended to replace advice given to you by your health care provider. Make sure you discuss any  questions you have with your health care provider. Document Revised: 12/15/2022 Document Reviewed: 05/03/2022 Elsevier Patient Education  2024 [employer]  Thank you for choosing me and  Gastroenterology.  Camie Furbish, PA-C

## 2024-01-23 ENCOUNTER — Other Ambulatory Visit: Payer: Self-pay

## 2024-01-25 ENCOUNTER — Encounter: Payer: Self-pay | Admitting: Gastroenterology

## 2024-01-25 ENCOUNTER — Ambulatory Visit: Admitting: Gastroenterology

## 2024-01-25 VITALS — BP 116/64 | HR 77 | Ht 63.5 in | Wt 137.1 lb

## 2024-01-25 DIAGNOSIS — Z860101 Personal history of adenomatous and serrated colon polyps: Secondary | ICD-10-CM

## 2024-01-25 DIAGNOSIS — R1032 Left lower quadrant pain: Secondary | ICD-10-CM | POA: Diagnosis not present

## 2024-01-25 DIAGNOSIS — K5732 Diverticulitis of large intestine without perforation or abscess without bleeding: Secondary | ICD-10-CM | POA: Diagnosis not present

## 2024-01-25 NOTE — Patient Instructions (Signed)
 Start Benefiber 1 tablespoon daily - stop if you have gas or bloating.High-Fiber Eating Plan Fiber, also called dietary fiber, is found in foods such as fruits, vegetables, whole grains, and beans. A high-fiber diet can be good for your health. Your health care provider may recommend a high-fiber diet to help: Prevent trouble pooping (constipation). Lower your cholesterol. Treat the following conditions: Hemorrhoids. This is inflammation of veins in the anus. Inflammation of specific areas of the digestive tract. Irritable bowel syndrome (IBS). This is a problem of the large intestine, also called the colon, that sometimes causes belly pain and bloating. Prevent overeating as part of a weight-loss plan. Lower the risk of heart disease, type 2 diabetes, and certain cancers. What are tips for following this plan? Reading food labels  Check the nutrition facts label on foods for the amount of dietary fiber. Choose foods that have 4 grams of fiber or more per serving. The recommended goals for how much fiber you should eat each day include: Males 40 years old or younger: 30-34 g. Males over 69 years old: 28-34 g. Females 13 years old or younger: 25-28 g. Females over 79 years old: 22-25 g. Your daily fiber goal is _____________ g. Shopping Choose whole fruits and vegetables instead of processed. For example, choose apples instead of apple juice or applesauce. Choose a variety of high-fiber foods such as avocados, lentils, oats, and pinto beans. Read the nutrition facts label on foods. Check for foods with added fiber. These foods often have high sugar and salt (sodium) amounts per serving. Cooking Use whole-grain flour for baking and cooking. Cook with brown rice instead of white rice. Make meals that have a lot of beans and vegetables in them, such as chili or vegetable-based soups. Meal planning Start the day with a breakfast that is high in fiber, such as a cereal that has 5 g of fiber  or more per serving. Eat breads and cereals that are made with whole-grain flour instead of refined flour or white flour. Eat brown rice, bulgur wheat, or millet instead of white rice. Use beans in place of meat in soups, salads, and pasta dishes. Be sure that half of the grains you eat each day are whole grains. General information You can get the recommended amount of dietary fiber by: Eating a variety of fruits, vegetables, grains, nuts, and beans. Taking a fiber supplement if you aren't able to eat enough fiber. It's better to get fiber through food than from a supplement. Slowly increase how much fiber you eat. If you increase the amount of fiber you eat too quickly, you may have bloating, cramping, or gas. Drink plenty of water to help you digest fiber. Choose high-fiber snacks, such as berries, raw vegetables, nuts, and popcorn. What foods should I eat? Fruits Berries. Pears. Apples. Oranges. Avocado. Prunes and raisins. Dried figs. Vegetables Sweet potatoes. Spinach. Kale. Artichokes. Cabbage. Broccoli. Cauliflower. Green peas. Carrots. Squash. Grains Whole-grain breads. Multigrain cereal. Oats and oatmeal. Brown rice. Barley. Bulgur wheat. Millet. Quinoa. Bran muffins. Popcorn. Rye wafer crackers. Meats and other proteins Navy beans, kidney beans, and pinto beans. Soybeans. Split peas. Lentils. Nuts and seeds. Dairy Fiber-fortified yogurt. Fortified means that fiber has been added to the product. Beverages Fiber-fortified soy milk. Fiber-fortified orange juice. Other foods Fiber bars. The items listed above may not be all the foods and drinks you can have. Talk to a dietitian to learn more. What foods should I avoid? Fruits Fruit juice. Cooked, strained fruit. Vegetables Brien  potatoes. Canned vegetables. Well-cooked vegetables. Grains White bread. Pasta made with refined flour. White rice. Meats and other proteins Fatty meat. Fried chicken or fried fish. Dairy Milk.  Cream cheese. Sour cream. Fats and oils Butters. Beverages Soft drinks. Other foods Cakes and pastries. The items listed above may not be all the foods and drinks you should avoid. Talk to a dietitian to learn more. This information is not intended to replace advice given to you by your health care provider. Make sure you discuss any questions you have with your health care provider. Document Revised: 06/06/2022 Document Reviewed: 06/06/2022 Elsevier Patient Education  2024 [employer].

## 2024-01-25 NOTE — Progress Notes (Signed)
 Jillian Patton [id number] May 25, 1965   Chief Complaint: Diverticulitis  Referring Provider: Verena Mems, MD Primary GI MD: Dr. 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    Patient last seen in office 02/09/2021 by Vina Dasen, NP for recent episode of uncomplicated sigmoid diverticulitis which was found on CT abdomen/pelvis in the ED.  Had total resolution of LLQ abdominal pain with course of Augmentin .  She was advised to start high-fiber diet.   Patient has family history of precancerous polyps in both parents in their early 66s.  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  Last seen in office 12/01/2023 for LLQ abdominal pain with concern for diverticulitis.  Pain level 4/10 in severity and reported symptoms felt similar to when she had had diverticulitis in the past.  Was tolerating a normal diet.  She was started on course of Augmentin  and CT A/P ordered for further evaluation.  CT scan showed uncomplicated diverticulitis without perforation, fluid collection, or abscess.  Stable uterine fibroid also seen.  Small nonobstructing right kidney stone.    Labs showed mild elevation in bilirubin, otherwise normal liver enzymes and normal kidney function.  No leukocytosis or anemia.  Patient noted that bilirubin often runs a little high.   Discussed the use of AI scribe software for clinical note transcription with the patient, who gave verbal consent to proceed.  History of Present Illness Jillian Patton is a 58 year old female with diverticulitis who presents for follow-up after recent treatment with antibiotics.  She has experienced improvement since starting antibiotics, with soreness resolving after approximately four days of treatment. This marks her second episode of  diverticulitis, with the first occurring in 2022. Both episodes were uncomplicated and confirmed by CT scan. No current constipation, diarrhea, or blood in stool, and she reports regular bowel movements. The abdominal pain has resolved, and she did not experience any fever during the episode.  She underwent a colonoscopy last year.  Her past medical history includes Gilbert's syndrome, which has been monitored by her primary care physician. She notes a family history of the same condition in her father. She mentions a slight elevation in bilirubin levels, which has been consistent with her history of Gilbert's syndrome.  She also mentions a recent finding of atherosclerosis and plans to consult a cardiologist for further evaluation, despite having normal cholesterol levels.   Previous GI Procedures/Imaging   CT A/P 12/01/2023 1. Focal acute uncomplicated diverticulitis at the junction of the descending and sigmoid colon. No perforation, fluid collection, or abscess. 2. Stable uterine fibroid. 3. Punctate 2 mm nonobstructing right renal calculus. 4.  Aortic Atherosclerosis (ICD10-I70.0).  Colonoscopy 07/13/2022 - Two 5 to 7 mm polyps in the distal transverse colon and at the hepatic flexure, removed with a cold snare. Resected and retrieved.  - Mild diverticulosis in the left colon.  - Internal hemorrhoids.  - The examination was otherwise normal on direct and retroflexion views. - Recall 5 years Path: Surgical [P], colon, hepatic flexure, transverse, polyp (2) TUBULAR ADENOMA, NEGATIVE FOR HIGH-GRADE DYSPLASIA. SESSILE SERRATED POLYP WITHOUT CYTOLOGIC DYSPLASIA.   CT A/P 01/16/2021 1. Acute uncomplicated sigmoid diverticulitis. 2. Bibasilar subsegmental atelectasis. 3. 3.9 cm mildly hypoenhancing mass in the left uterine fundus, favored represent uterine leiomyoma. Nonemergent pelvic ultrasound could be considered for  further evaluation   Past Medical History:  Diagnosis Date    Allergy    Anxiety    GERD (gastroesophageal reflux disease)    History of diverticulitis    Hypertension     Past Surgical History:  Procedure Laterality Date   APPENDECTOMY     CHOLECYSTECTOMY     COLONOSCOPY      Current Outpatient Medications  Medication Sig Dispense Refill   ALPRAZolam (XANAX) 0.5 MG tablet Take 0.5 mg by mouth at bedtime as needed for anxiety.     calcium carbonate (SUPER CALCIUM) 1500 (600 Ca) MG TABS tablet 1 tablet with meals Orally once a day     Calcium Citrate (CITRACAL PO) Take 1 tablet by mouth daily.      cholecalciferol (VITAMIN D) 1000 UNITS tablet Take 1,000 Units by mouth daily.     cyclobenzaprine (FLEXERIL) 5 MG tablet Take 5 mg by mouth every 8 (eight) hours as needed.     escitalopram (LEXAPRO) 20 MG tablet Take 1 tablet by mouth daily.     Multiple Vitamin (MULTI-DAY PO) Take by mouth.     nitrofurantoin, macrocrystal-monohydrate, (MACROBID) 100 MG capsule Take 100 mg by mouth daily as needed.     olmesartan (BENICAR) 20 MG tablet Take 20 mg by mouth daily.     ZEPBOUND  10 MG/0.5ML Pen Inject 10 mg into the skin once a week.     No current facility-administered medications for this visit.    Allergies as of 01/25/2024 - Review Complete 01/25/2024  Allergen Reaction Noted   Latex Rash 05/11/2015   Sulfa antibiotics Dermatitis 01/19/2021    Family History  Problem Relation Age of Onset   Hypertension Mother    Colon polyps Mother    Breast cancer Mother        72's   Hypertension Father    Colon polyps Father    Colon cancer Neg Hx    Esophageal cancer Neg Hx    Pancreatic cancer Neg Hx    Rectal cancer Neg Hx    Stomach cancer Neg Hx     Social History   Tobacco Use   Smoking status: Never   Smokeless tobacco: Never  Substance Use Topics   Alcohol use: Yes    Alcohol/week: 4.0 standard drinks of alcohol    Types: 4 Standard drinks or equivalent per week   Drug use: No     Review of Systems:    Constitutional: No  weight loss, fever, chills Cardiovascular: No chest pain Respiratory: No SOB  Gastrointestinal: See HPI and otherwise negative   Physical Exam:  Vital signs: BP 116/64 (BP Location: Left Arm, Patient Position: Sitting, Cuff Size: Normal)   Pulse 77   Ht 5' 3.5 (1.613 m)   Wt 137 lb 2 oz (62.2 kg)   LMP 02/18/2012   BMI 23.91 kg/m   Constitutional: Pleasant, well-appearing female in NAD, alert and cooperative Head:  Normocephalic and atraumatic.  Eyes: No scleral icterus.  Respiratory: Respirations even and unlabored. Lungs clear to auscultation bilaterally.  No wheezes, crackles, or rhonchi.  Cardiovascular:  Regular rate and rhythm. No murmurs. No peripheral edema. Gastrointestinal:  Soft, nondistended, nontender. No rebound or guarding. Normal bowel sounds. No appreciable masses or hepatomegaly. Rectal:  Not performed.  Neurologic:  Alert and oriented x4;  grossly normal neurologically.  Skin:   Dry and intact without significant lesions or rashes. Psychiatric: Oriented to person, place and time. Demonstrates good judgement and reason without abnormal affect or behaviors.  RELEVANT LABS AND IMAGING: CBC    Component Value Date/Time   WBC 7.1 12/01/2023 1433   RBC 4.14 12/01/2023 1433   HGB 13.1 12/01/2023 1433   HCT 37.8 12/01/2023 1433   PLT 231.0 12/01/2023 1433   MCV 91.2 12/01/2023 1433   MCH 31.1 01/16/2021 0956   MCHC 34.7 12/01/2023 1433   RDW 12.4 12/01/2023 1433   LYMPHSABS 1.9 12/01/2023 1433   MONOABS 0.4 12/01/2023 1433   EOSABS 0.1 12/01/2023 1433   BASOSABS 0.0 12/01/2023 1433    CMP     Component Value Date/Time   NA 139 12/01/2023 1433   K 3.9 12/01/2023 1433   CL 100 12/01/2023 1433   CO2 32 12/01/2023 1433   GLUCOSE 104 (H) 12/01/2023 1433   BUN 24 (H) 12/01/2023 1433   CREATININE 0.67 12/01/2023 1433   CALCIUM 9.1 12/01/2023 1433   PROT 6.8 12/01/2023 1433   ALBUMIN 4.5 12/01/2023 1433   AST 20 12/01/2023 1433   ALT 22 12/01/2023 1433    ALKPHOS 44 12/01/2023 1433   BILITOT 1.3 (H) 12/01/2023 1433   GFRNONAA >60 01/16/2021 0956     Assessment/Plan:   Diverticulitis LLQ abdominal pain Patient seen today for follow-up of recent episode of diverticulitis.  Was treated with course of Augmentin  and noted symptom improvement around day 4 of treatment.  The only symptom she was experiencing was LLQ pain which was mild, not higher than 4/10 in severity.  Acute uncomplicated diverticulitis was confirmed on CT A/P 12/01/2023.  She feels well and denies any recurrence of abdominal pain.  No new concerns today.  Up-to-date on colon cancer screening and will be due again for surveillance in 2029.  - High fiber diet - Add fiber supplement such as Benefiber 1 TBSP daily - Call if recurrence of symptoms - Follow up as needed   Camie Furbish, PA-C [facility] 01/25/2024, 4:40 PM  Patient Care Team: Verena Mems, MD as PCP - General (Family Medicine)

## 2024-02-02 NOTE — Progress Notes (Signed)
 Cardiology Office Note:    Date:  02/06/2024   ID:  KELSY POLACK, DOB 09/03/1965, MRN [id number]  PCP:  Verena Mems, MD   [facility] Providers Cardiologist:  None     Referring MD: Verena Mems, MD   Chief Complaint  Patient presents with   Hypertension   New Patient (Initial Visit)    History of Present Illness:    Jillian Patton is a 58 y.o. female is self referred for management of HTN and aortic atherosclerosis. She is a teacher, early years/pre. Generally in good health. Long history of HTN controlled on medication. Had prediabetes at one point but lost 45 lbs on GLP 1 agonist and sugars are good. Now. She had a CT scan for evaluation of diverticulitis and was noted to have some aortic calcification. No chest pain, dyspnea.   Past Medical History:  Diagnosis Date   Allergy    Anxiety    GERD (gastroesophageal reflux disease)    History of diverticulitis    Hypertension    Pre-diabetes     Past Surgical History:  Procedure Laterality Date   APPENDECTOMY     CHOLECYSTECTOMY     COLONOSCOPY      Current Medications: Current Meds  Medication Sig   ALPRAZolam (XANAX) 0.5 MG tablet Take 0.5 mg by mouth at bedtime as needed for anxiety.   Calcium Citrate (CITRACAL PO) Take 1 tablet by mouth daily.    cholecalciferol (VITAMIN D) 1000 UNITS tablet Take 1,000 Units by mouth daily.   cyclobenzaprine (FLEXERIL) 5 MG tablet Take 5 mg by mouth every 8 (eight) hours as needed.   escitalopram (LEXAPRO) 20 MG tablet Take 1 tablet by mouth daily.   Multiple Vitamin (MULTI-DAY PO) Take by mouth.   nitrofurantoin, macrocrystal-monohydrate, (MACROBID) 100 MG capsule Take 100 mg by mouth daily as needed.   olmesartan (BENICAR) 20 MG tablet Take 20 mg by mouth daily.   rosuvastatin (CRESTOR) 5 MG tablet Take 1 tablet (5 mg total) by mouth daily.   ZEPBOUND  10 MG/0.5ML Pen Inject 10 mg into the skin once a week.     Allergies:   Latex and Sulfa antibiotics    Social History   Socioeconomic History   Marital status: Married    Spouse name: Not on file   Number of children: 2   Years of education: Not on file   Highest education level: Not on file  Occupational History   Not on file  Tobacco Use   Smoking status: Never   Smokeless tobacco: Never  Vaping Use   Vaping status: Never Used  Substance and Sexual Activity   Alcohol use: Yes    Alcohol/week: 4.0 standard drinks of alcohol    Types: 4 Standard drinks or equivalent per week   Drug use: No   Sexual activity: Not on file  Other Topics Concern   Not on file  Social History Narrative   Pharmacist      Social Drivers of Health   Financial Resource Strain: Not on file  Food Insecurity: Not on file  Transportation Needs: Not on file  Physical Activity: Not on file  Stress: Not on file  Social Connections: Not on file     Family History: The patient's family history includes Arrhythmia in her father; Breast cancer in her mother; Colon polyps in her father and mother; Heart attack in her paternal grandmother; Hypertension in her father and mother. There is no history of Colon cancer, Esophageal cancer, Pancreatic cancer, Rectal  cancer, or Stomach cancer.  ROS:   Please see the history of present illness.     All other systems reviewed and are negative.  EKGs/Labs/Other Studies Reviewed:    The following studies were reviewed today: EKG Interpretation Date/Time:  Tuesday February 06 2024 13:28:41 EST Ventricular Rate:  75 PR Interval:  148 QRS Duration:  98 QT Interval:  376 QTC Calculation: 419 R Axis:   90  Text Interpretation: Normal sinus rhythm Rightward axis Incomplete right bundle branch block No previous ECGs available Confirmed by Ridott Shellhammer [phone]) on 02/06/2024 1:40:28 PM   EKG Interpretation Date/Time:  Tuesday February 06 2024 13:28:41 EST Ventricular Rate:  75 PR Interval:  148 QRS Duration:  98 QT Interval:  376 QTC Calculation: 419 R  Axis:   90  Text Interpretation: Normal sinus rhythm Rightward axis Incomplete right bundle branch block No previous ECGs available Confirmed by Levy Wellman [phone]) on 02/06/2024 1:40:28 PM    Recent Labs: 12/01/2023: ALT 22; BUN 24; Creatinine, Ser 0.67; Hemoglobin 13.1; Platelets 231.0; Potassium 3.9; Sodium 139  Recent Lipid Panel No results found for: CHOL, TRIG, HDL, CHOLHDL, VLDL, LDLCALC, LDLDIRECT   Risk Assessment/Calculations:      Physical Exam:    VS:  BP 130/74 (BP Location: Left Arm, Patient Position: Sitting, Cuff Size: Normal)   Pulse 75   Resp 17   Ht 5' 3 (1.6 m)   Wt 138 lb (62.6 kg)   LMP 02/18/2012   BMI 24.45 kg/m     Wt Readings from Last 3 Encounters:  02/06/24 138 lb (62.6 kg)  01/25/24 137 lb 2 oz (62.2 kg)  12/01/23 139 lb (63 kg)     GEN:  Well nourished, well developed in no acute distress HEENT: Normal NECK: No JVD; No carotid bruits LYMPHATICS: No lymphadenopathy CARDIAC: RRR, no murmurs, rubs, gallops RESPIRATORY:  Clear to auscultation without rales, wheezing or rhonchi  ABDOMEN: Soft, non-tender, non-distended MUSCULOSKELETAL:  No edema; No deformity  SKIN: Warm and dry NEUROLOGIC:  Alert and oriented x 3 PSYCHIATRIC:  Normal affect   ASSESSMENT:    1. Primary hypertension   2. Aortic atherosclerosis    PLAN:    In order of problems listed above:  HTN well controlled.  Aortic atherosclerosis. No symptoms. I reviewed CT and it actually included coronary arteries which showed no calcification. Discussed lifestyle modification. Suggested getting LDL < 70. Last 92. Will start Crestor 5 mg daily. Will follow up with Dr Verena. Can see me as needed.            Medication Adjustments/Labs and Tests Ordered: Current medicines are reviewed at length with the patient today.  Concerns regarding medicines are outlined above.  Orders Placed This Encounter  Procedures   EKG 12-Lead   Meds ordered this encounter   Medications   rosuvastatin (CRESTOR) 5 MG tablet    Sig: Take 1 tablet (5 mg total) by mouth daily.    Dispense:  90 tablet    Refill:  3    There are no Patient Instructions on file for this visit.   Signed, Dashae Wilcher, MD  02/06/2024 1:49 PM    [facility]

## 2024-02-06 ENCOUNTER — Ambulatory Visit: Attending: Cardiology | Admitting: Cardiology

## 2024-02-06 ENCOUNTER — Encounter: Payer: Self-pay | Admitting: Cardiology

## 2024-02-06 VITALS — BP 130/74 | HR 75 | Resp 17 | Ht 63.0 in | Wt 138.0 lb

## 2024-02-06 DIAGNOSIS — I700 Atherosclerosis of aorta: Secondary | ICD-10-CM | POA: Diagnosis not present

## 2024-02-06 DIAGNOSIS — I10 Essential (primary) hypertension: Secondary | ICD-10-CM | POA: Diagnosis not present

## 2024-02-06 MED ORDER — ROSUVASTATIN CALCIUM 5 MG PO TABS: 5.0000 mg | ORAL_TABLET | Freq: Every day | ORAL | 3 refills | Status: AC

## 2024-02-06 NOTE — Patient Instructions (Signed)
 Medication Instructions:  Start Crestor 5 mg daily Continue all other medications  Lab Work: Follow up Lipid Panel with PCP  Testing/Procedures: None ordered  Follow-Up: At [facility], you and your health needs are our priority.  As part of our continuing mission to provide you with exceptional heart care, our providers are all part of one team.  This team includes your primary Cardiologist (physician) and Advanced Practice Providers or APPs (Physician Assistants and Nurse Practitioners) who all work together to provide you with the care you need, when you need it.  Your next appointment:  As Needed    Provider:  Dr.Jordan    We recommend signing up for the patient portal called MyChart.  Sign up information is provided on this After Visit Summary.  MyChart is used to connect with patients for Virtual Visits (Telemedicine).  Patients are able to view lab/test results, encounter notes, upcoming appointments, etc.  Non-urgent messages can be sent to your provider as well.   To learn more about what you can do with MyChart, go to [email or web address].

## 2024-03-16 ENCOUNTER — Telehealth: Admitting: Family Medicine

## 2024-03-16 ENCOUNTER — Telehealth

## 2024-03-16 DIAGNOSIS — R0982 Postnasal drip: Secondary | ICD-10-CM | POA: Diagnosis not present

## 2024-03-16 MED ORDER — BENZONATATE 200 MG PO CAPS: 200.0000 mg | ORAL_CAPSULE | Freq: Three times a day (TID) | ORAL | 0 refills | Status: AC | PRN

## 2024-03-16 NOTE — Progress Notes (Signed)
 " Virtual Visit Consent   Jillian Patton, you are scheduled for a virtual visit with a [facility] provider today. Just as with appointments in the office, your consent must be obtained to participate. Your consent will be active for this visit and any virtual visit you may have with one of our providers in the next 365 days. If you have a MyChart account, a copy of this consent can be sent to you electronically.  As this is a virtual visit, video technology does not allow for your provider to perform a traditional examination. This may limit your provider's ability to fully assess your condition. If your provider identifies any concerns that need to be evaluated in person or the need to arrange testing (such as labs, EKG, etc.), we will make arrangements to do so. Although advances in technology are sophisticated, we cannot ensure that it will always work on either your end or our end. If the connection with a video visit is poor, the visit may have to be switched to a telephone visit. With either a video or telephone visit, we are not always able to ensure that we have a secure connection.  By engaging in this virtual visit, you consent to the provision of healthcare and authorize for your insurance to be billed (if applicable) for the services provided during this visit. Depending on your insurance coverage, you may receive a charge related to this service.  I need to obtain your verbal consent now. Are you willing to proceed with your visit today? Jillian Patton has provided verbal consent on 03/16/2024 for a virtual visit (video or telephone). Loa Lamp, FNP  Date: 03/16/2024 2:49 PM   Virtual Visit via Video Note   I, Loa Lamp, connected with  Jillian Patton  ([id number], 1965/08/11) on 03/16/2024 at  2:45 PM EST by a video-enabled telemedicine application and verified that I am speaking with the correct person using two identifiers.  Location: Patient: Virtual Visit Location  Patient: Home Provider: Virtual Visit Location Provider: Home Office   I discussed the limitations of evaluation and management by telemedicine and the availability of in person appointments. The patient expressed understanding and agreed to proceed.    History of Present Illness: Jillian Patton is a 58 y.o. who identifies as a female who was assigned female at birth, and is being seen today for severe post nasal drainage, has this 2 yrs ago and her provider gave her tessalon  and sx resolved. No fever. Feels fine other than post nasal drainage. SABRA  HPI: HPI  Problems:  Patient Active Problem List   Diagnosis Date Noted   Hypertensive disorder 05/23/2022   Prediabetes 01/26/2021   Seasonal allergies 06/16/2020   Carpal tunnel syndrome, bilateral 05/11/2015   DIARRHEA 10/23/2007    Allergies: Allergies[1] Medications: Current Medications[2]  Observations/Objective: Patient is well-developed, well-nourished in no acute distress.  Resting comfortably  at home.  Head is normocephalic, atraumatic.  No labored breathing.  Speech is clear and coherent with logical content.  Patient is alert and oriented at baseline.    Assessment and Plan: 1. Post-nasal drainage (Primary)  Claritin and flonase, UC as needed.   Follow Up Instructions: I discussed the assessment and treatment plan with the patient. The patient was provided an opportunity to ask questions and all were answered. The patient agreed with the plan and demonstrated an understanding of the instructions.  A copy of instructions were sent to the patient via MyChart unless otherwise noted below.  The patient was advised to call back or seek an in-person evaluation if the symptoms worsen or if the condition fails to improve as anticipated.    Markeesha Char, FNP     [1]  Allergies Allergen Reactions   Latex Rash   Sulfa Antibiotics Dermatitis    Other reaction(s): rash  [2]  Current Outpatient Medications:     benzonatate  (TESSALON ) 200 MG capsule, Take 1 capsule (200 mg total) by mouth 3 (three) times daily as needed for up to 10 days for cough., Disp: 30 capsule, Rfl: 0   ALPRAZolam (XANAX) 0.5 MG tablet, Take 0.5 mg by mouth at bedtime as needed for anxiety., Disp: , Rfl:    calcium  carbonate (SUPER CALCIUM ) 1500 (600 Ca) MG TABS tablet, 1 tablet with meals Orally once a day (Patient not taking: Reported on 02/06/2024), Disp: , Rfl:    Calcium  Citrate (CITRACAL PO), Take 1 tablet by mouth daily. , Disp: , Rfl:    cholecalciferol (VITAMIN D) 1000 UNITS tablet, Take 1,000 Units by mouth daily., Disp: , Rfl:    cyclobenzaprine (FLEXERIL) 5 MG tablet, Take 5 mg by mouth every 8 (eight) hours as needed., Disp: , Rfl:    escitalopram (LEXAPRO) 20 MG tablet, Take 1 tablet by mouth daily., Disp: , Rfl:    Multiple Vitamin (MULTI-DAY PO), Take by mouth., Disp: , Rfl:    nitrofurantoin, macrocrystal-monohydrate, (MACROBID) 100 MG capsule, Take 100 mg by mouth daily as needed., Disp: , Rfl:    olmesartan (BENICAR) 20 MG tablet, Take 20 mg by mouth daily., Disp: , Rfl:    rosuvastatin  (CRESTOR ) 5 MG tablet, Take 1 tablet (5 mg total) by mouth daily., Disp: 90 tablet, Rfl: 3   ZEPBOUND  10 MG/0.5ML Pen, Inject 10 mg into the skin once a week., Disp: , Rfl:   "

## 2024-03-16 NOTE — Patient Instructions (Signed)
 Postnasal Drip Postnasal drip is the feeling of mucus going down the back of your throat. Mucus is a slimy substance that moistens and cleans your nose and throat, as well as the air pockets in face bones near your forehead and cheeks (sinuses). Small amounts of mucus pass from your nose and sinuses down the back of your throat all the time. This is normal. When you produce too much mucus or the mucus gets too thick, you can feel it. Some common causes of postnasal drip include: Having more mucus because of: A cold or the flu. Allergies. Cold air. Certain medicines. Gastroesophageal reflux. Having more mucus that is thicker because of: A sinus or nasal infection. Dry air. A food allergy. Follow these instructions at home: Relieving discomfort  Gargle with a mixture of salt and water 3-4 times a day or as needed. To make salt water, completely dissolve -1 tsp (3-6 g) of salt in 1 cup (237 mL) of warm water. If the air in your home is dry, use a humidifier to add moisture to the air. Use a saline spray or a container (neti pot) to flush out the nose (nasal irrigation). These methods can help clear away mucus and keep the nasal passages moist. General instructions Take over-the-counter and prescription medicines only as told by your health care provider. Follow instructions from your health care provider about eating or drinking restrictions. You may need to avoid caffeine. Avoid things that you know you are allergic to (allergens), like dust, mold, pollen, pets, or certain foods. Drink enough fluid to keep your urine pale yellow. Keep all follow-up visits. This is important. Contact a health care provider if: You have a fever. You have a sore throat or difficulty swallowing. You have a headache. You have sinus or ear pain. You have a cough that does not go away. The mucus from your nose becomes thick and is green or yellow in color. You have cold or flu symptoms that last more than 10  days. Summary Postnasal drip is the feeling of mucus going down the back of your throat. Use nasal irrigation or a nasal spray to help clear away mucus and keep the nasal passages moist. Avoid things that you know you are allergic to (allergens), like dust, mold, pollen, pets, or certain foods. This information is not intended to replace advice given to you by your health care provider. Make sure you discuss any questions you have with your health care provider. Document Revised: 02/11/2021 Document Reviewed: 02/11/2021 Elsevier Patient Education  2024 [employer].
